# Patient Record
Sex: Male | Born: 1985 | Race: Black or African American | Hispanic: No | Marital: Single | Smoking: Light tobacco smoker
Health system: Southern US, Community
[De-identification: ages and names within clinical notes are randomized; demographics above are authoritative.]

## PROBLEM LIST (undated history)

## (undated) DIAGNOSIS — J309 Allergic rhinitis, unspecified: Secondary | ICD-10-CM

## (undated) DIAGNOSIS — E669 Obesity, unspecified: Secondary | ICD-10-CM

## (undated) DIAGNOSIS — N200 Calculus of kidney: Secondary | ICD-10-CM

## (undated) HISTORY — DX: Calculus of kidney: N20.0

## (undated) HISTORY — PX: LITHOTRIPSY: SUR834

## (undated) HISTORY — DX: Allergic rhinitis, unspecified: J30.9

## (undated) HISTORY — PX: WISDOM TOOTH EXTRACTION: SHX21

## (undated) HISTORY — DX: Obesity, unspecified: E66.9

---

## 2006-07-11 ENCOUNTER — Emergency Department (HOSPITAL_COMMUNITY): Admission: EM | Admit: 2006-07-11 | Discharge: 2006-07-11 | Payer: Self-pay | Admitting: Emergency Medicine

## 2007-04-21 ENCOUNTER — Emergency Department (HOSPITAL_COMMUNITY): Admission: EM | Admit: 2007-04-21 | Discharge: 2007-04-21 | Payer: Self-pay | Admitting: *Deleted

## 2008-03-28 ENCOUNTER — Emergency Department (HOSPITAL_COMMUNITY): Admission: EM | Admit: 2008-03-28 | Discharge: 2008-03-28 | Payer: Self-pay | Admitting: Emergency Medicine

## 2008-04-07 ENCOUNTER — Ambulatory Visit (HOSPITAL_COMMUNITY): Admission: RE | Admit: 2008-04-07 | Discharge: 2008-04-07 | Payer: Self-pay | Admitting: Urology

## 2008-06-05 ENCOUNTER — Emergency Department (HOSPITAL_COMMUNITY): Admission: EM | Admit: 2008-06-05 | Discharge: 2008-06-05 | Payer: Self-pay | Admitting: Emergency Medicine

## 2009-07-29 DIAGNOSIS — N200 Calculus of kidney: Secondary | ICD-10-CM

## 2009-07-29 HISTORY — DX: Calculus of kidney: N20.0

## 2009-08-23 ENCOUNTER — Emergency Department (HOSPITAL_COMMUNITY): Admission: EM | Admit: 2009-08-23 | Discharge: 2009-08-23 | Payer: Self-pay | Admitting: Emergency Medicine

## 2010-12-06 ENCOUNTER — Emergency Department (HOSPITAL_COMMUNITY)
Admission: EM | Admit: 2010-12-06 | Discharge: 2010-12-06 | Disposition: A | Discharge status: Home / Self Care | Payer: Self-pay | Attending: Emergency Medicine | Admitting: Emergency Medicine

## 2010-12-06 ENCOUNTER — Emergency Department (HOSPITAL_COMMUNITY): Payer: Self-pay

## 2010-12-06 DIAGNOSIS — R071 Chest pain on breathing: Secondary | ICD-10-CM | POA: Insufficient documentation

## 2010-12-09 ENCOUNTER — Emergency Department (HOSPITAL_COMMUNITY): Payer: Self-pay

## 2010-12-09 ENCOUNTER — Emergency Department (HOSPITAL_COMMUNITY)
Admission: EM | Admit: 2010-12-09 | Discharge: 2010-12-09 | Disposition: A | Discharge status: Home / Self Care | Payer: Self-pay | Attending: Emergency Medicine | Admitting: Emergency Medicine

## 2010-12-09 DIAGNOSIS — R0602 Shortness of breath: Secondary | ICD-10-CM | POA: Insufficient documentation

## 2010-12-09 DIAGNOSIS — R079 Chest pain, unspecified: Secondary | ICD-10-CM | POA: Insufficient documentation

## 2010-12-09 DIAGNOSIS — R071 Chest pain on breathing: Secondary | ICD-10-CM | POA: Insufficient documentation

## 2010-12-09 DIAGNOSIS — K7689 Other specified diseases of liver: Secondary | ICD-10-CM | POA: Insufficient documentation

## 2010-12-09 LAB — POCT I-STAT, CHEM 8
BUN: 12 mg/dL (ref 6–23)
Calcium, Ion: 1.15 mmol/L (ref 1.12–1.32)
Chloride: 102 mEq/L (ref 96–112)
Glucose, Bld: 91 mg/dL (ref 70–99)
HCT: 41 % (ref 39.0–52.0)
TCO2: 26 mmol/L (ref 0–100)

## 2010-12-09 LAB — POCT CARDIAC MARKERS: Troponin i, poc: <0.05 ng/mL (ref 0.00–0.09)

## 2010-12-09 MED ORDER — IOHEXOL 300 MG/ML  SOLN
100.0000 mL | Freq: Once | INTRAMUSCULAR | Status: AC | PRN
  Administered 2010-12-09: 100 mL via INTRAVENOUS

## 2011-04-30 LAB — URINALYSIS, ROUTINE W REFLEX MICROSCOPIC
Glucose, UA: NEGATIVE
Leukocytes, UA: NEGATIVE
Nitrite: NEGATIVE
Specific Gravity, Urine: 1.028
pH: 7

## 2011-04-30 LAB — URINE MICROSCOPIC-ADD ON

## 2011-06-18 ENCOUNTER — Emergency Department (HOSPITAL_COMMUNITY)
Admission: EM | Admit: 2011-06-18 | Discharge: 2011-06-18 | Disposition: A | Discharge status: Home / Self Care | Payer: Self-pay | Attending: Emergency Medicine | Admitting: Emergency Medicine

## 2011-06-18 DIAGNOSIS — R51 Headache: Secondary | ICD-10-CM | POA: Insufficient documentation

## 2011-06-18 DIAGNOSIS — J3489 Other specified disorders of nose and nasal sinuses: Secondary | ICD-10-CM | POA: Insufficient documentation

## 2011-06-18 DIAGNOSIS — J069 Acute upper respiratory infection, unspecified: Secondary | ICD-10-CM | POA: Insufficient documentation

## 2011-06-18 MED ORDER — DICLOFENAC SODIUM 75 MG PO TBEC: 75.0000 mg | DELAYED_RELEASE_TABLET | Freq: Two times a day (BID) | ORAL | Status: DC

## 2011-06-18 MED ORDER — LIDOCAINE VISCOUS 2 % MT SOLN: 20.0000 mL | OROMUCOSAL | Status: AC | PRN

## 2011-06-18 MED ORDER — PREDNISONE (PAK) 10 MG PO TABS: ORAL_TABLET | ORAL | Status: AC

## 2011-06-18 NOTE — ED Provider Notes (Signed)
History     CSN: [id number] Arrival date & time: 06/18/2011  6:46 PM   First MD Initiated Contact with Patient 06/18/11 2045   9:11 PM HPI  Patient is a 25 y.o. male presenting with URI. The history is provided by the patient.  URI The primary symptoms include headaches, sore throat and swollen glands. Primary symptoms do not include fever, ear pain, cough, wheezing, nausea, vomiting, arthralgias or rash. The current episode started today. The problem has been gradually worsening.  The sore throat is not accompanied by trouble swallowing.  The swelling is not associated with trouble swallowing.  Symptoms associated with the illness include facial pain, sinus pressure, congestion and rhinorrhea. The illness is not associated with chills. The following treatments were addressed: A decongestant was ineffective.    Past Medical History  Diagnosis Date  . Irregular heart beat     Past Surgical History  Procedure Date  . Lithotripsy     History reviewed. No pertinent family history.  History  Substance Use Topics  . Smoking status: Current Some Day Smoker  . Smokeless tobacco: Not on file  . Alcohol Use: Yes     occasionally      Review of Systems  Constitutional: Negative for fever and chills.  HENT: Positive for congestion, sore throat, rhinorrhea and sinus pressure. Negative for hearing loss, ear pain, mouth sores, trouble swallowing and dental problem.   Respiratory: Negative for cough, shortness of breath and wheezing.   Cardiovascular: Negative for chest pain.  Gastrointestinal: Negative for nausea and vomiting.  Musculoskeletal: Negative for arthralgias.  Skin: Negative for rash.  Neurological: Positive for headaches.    Allergies  Review of patient's allergies indicates no known allergies.  Home Medications   Current Outpatient Rx  Name Route Sig Dispense Refill  . NAPROXEN SODIUM 220 MG PO TABS Oral Take 220-440 mg by mouth as needed. Pain.       BP  132/78  Pulse 73  Temp(Src) 98 F (36.7 C) (Oral)  Resp 16  Ht 6\' 3"  (1.905 m)  Wt 220 lb (99.791 kg)  BMI 27.50 kg/m2  SpO2 100%  Physical Exam  Vitals reviewed. Constitutional: He is oriented to person, place, and time. He appears well-developed and well-nourished.  HENT:  Head: Normocephalic and atraumatic.  Right Ear: Tympanic membrane and ear canal normal.  Left Ear: External ear and ear canal normal.  Nose: Right sinus exhibits maxillary sinus tenderness. Right sinus exhibits no frontal sinus tenderness. Left sinus exhibits maxillary sinus tenderness. Left sinus exhibits no frontal sinus tenderness.  Mouth/Throat: Uvula is midline, oropharynx is clear and moist and mucous membranes are normal. No oropharyngeal exudate.       Transillumination negative for sinus infection.  Eyes: Conjunctivae are normal. Pupils are equal, round, and reactive to light.  Neck: Normal range of motion. Neck supple.  Cardiovascular: Normal rate, regular rhythm and normal heart sounds.   Pulmonary/Chest: Effort normal and breath sounds normal. He has no wheezes. He has no rales. He exhibits no tenderness.  Abdominal: Soft. Bowel sounds are normal.  Neurological: He is alert and oriented to person, place, and time.  Skin: Skin is warm and dry. No rash noted. No erythema. No pallor.  Psychiatric: He has a normal mood and affect. His behavior is normal.    ED Course  Procedures  This is requesting antibiotics even if strep test is normal. Had a discussion with patient and family regarding inappropriate antibiotic use. Patient is not happy with  this decision however advised him that I would not be prescribing antibiotics as strep test was negative. We'll give symptomatic treatment with viscous lidocaine, and NSAIDs. MDM          Thomasene Lot, PA 06/18/11 2118

## 2011-06-18 NOTE — ED Provider Notes (Signed)
Medical screening examination/treatment/procedure(s) were performed by non-physician practitioner and as supervising physician I was immediately available for consultation/collaboration.  Cristella Stiver K Srinivas Lippman-Rasch, MD 06/18/11 2246 

## 2011-11-12 ENCOUNTER — Encounter (HOSPITAL_COMMUNITY): Payer: Self-pay | Admitting: *Deleted

## 2011-11-12 ENCOUNTER — Emergency Department (HOSPITAL_COMMUNITY): Payer: Self-pay

## 2011-11-12 ENCOUNTER — Emergency Department (HOSPITAL_COMMUNITY)
Admission: EM | Admit: 2011-11-12 | Discharge: 2011-11-12 | Disposition: A | Discharge status: Home / Self Care | Payer: Self-pay | Attending: Emergency Medicine | Admitting: Emergency Medicine

## 2011-11-12 DIAGNOSIS — M25569 Pain in unspecified knee: Secondary | ICD-10-CM | POA: Insufficient documentation

## 2011-11-12 DIAGNOSIS — M25561 Pain in right knee: Secondary | ICD-10-CM

## 2011-11-12 DIAGNOSIS — M25469 Effusion, unspecified knee: Secondary | ICD-10-CM | POA: Insufficient documentation

## 2011-11-12 MED ORDER — OXYCODONE-ACETAMINOPHEN 5-325 MG PO TABS: 1.0000 | ORAL_TABLET | Freq: Four times a day (QID) | ORAL | Status: AC | PRN

## 2011-11-12 MED ORDER — IBUPROFEN 800 MG PO TABS
800.0000 mg | ORAL_TABLET | Freq: Once | ORAL | Status: AC
  Administered 2011-11-12: 800 mg via ORAL
  Filled 2011-11-12: qty 1

## 2011-11-12 MED ORDER — IBUPROFEN 600 MG PO TABS: 600.0000 mg | ORAL_TABLET | Freq: Four times a day (QID) | ORAL | Status: AC | PRN

## 2011-11-12 NOTE — ED Notes (Signed)
Patient transported to X-ray 

## 2011-11-12 NOTE — ED Provider Notes (Signed)
History     CSN: [id number]  Arrival date & time 11/12/11  1315   First MD Initiated Contact with Patient 11/12/11 1438      Chief Complaint  Patient presents with  . Knee Pain    (Consider location/radiation/quality/duration/timing/severity/associated sxs/prior treatment) The history is provided by the patient.  26 y/o M with c/c of right knee pain x 1 day. Playing basketball yesterday, slipped and sustained hyperextension injury. Pain constant, throbbing, worse with attempted knee movement and ambulation. Positive swelling. Negative associated numbness or weakness. No known relieving factors. No prior treatment.  Past Medical History  Diagnosis Date  . Irregular heart beat     Past Surgical History  Procedure Date  . Lithotripsy     No family history on file.  History  Substance Use Topics  . Smoking status: Former Games developer  . Smokeless tobacco: Not on file  . Alcohol Use: Yes     occasionally      Review of Systems  Constitutional: Negative for fever and chills.  Musculoskeletal:       See HPI, otherwise negative  Skin: Negative for color change and wound.  Neurological: Negative for weakness and numbness.    Allergies  Review of patient's allergies indicates no known allergies.  Home Medications  No current outpatient prescriptions on file.  BP 113/66  Pulse 95  Temp(Src) 98.4 F (36.9 C) (Oral)  Resp 18  Wt 230 lb 9.6 oz (104.599 kg)  SpO2 98%  Physical Exam  Nursing note and vitals reviewed. Constitutional: He is oriented to person, place, and time. He appears well-developed and well-nourished. No distress.  HENT:  Head: Normocephalic and atraumatic.  Right Ear: External ear normal.  Left Ear: External ear normal.  Eyes: Right eye exhibits no discharge. Left eye exhibits no discharge.  Neck: Neck supple.  Cardiovascular: Normal rate and regular rhythm.   Pulses:      Dorsalis pedis pulses are 2+ on the right side, and 2+ on the left side.    Pulmonary/Chest: Effort normal. No respiratory distress.  Musculoskeletal:       Right knee: He exhibits decreased range of motion and effusion. He exhibits no ecchymosis, no deformity, no erythema, normal alignment, no LCL laxity, normal patellar mobility and no MCL laxity. tenderness (popliteal fossa) found. Lateral joint line and LCL tenderness noted. No medial joint line, no MCL and no patellar tendon tenderness noted.       Isolated hip flex/ext, knee flex/ext, foot plantar/dorsi flex 5/5 and symmetric bilaterally with small movements as pt does not tolerate full ROM in the right knee. MSK exam otherwise without tenderness or edema.  Neurological: He is alert and oriented to person, place, and time.       Sensation intact to light touch and symmetric in BLE distally. Limping/antalgic gait without ataxia.  Skin: Skin is warm and dry. No rash noted. No erythema.  Psychiatric: He has a normal mood and affect.    ED Course  Procedures (including critical care time)  Labs Reviewed - No data to display Dg Knee Complete 4 Views Right  11/12/2011  *RADIOLOGY REPORT*  Clinical Data: Fall, lateral knee pain  RIGHT KNEE - COMPLETE 4+ VIEW  Comparison: None.  Findings: Trace suprapatellar fluid is noted.  No fracture or dislocation.  No radiopaque foreign body.  IMPRESSION: No acute bony abnormality.  Trace suprapatellar effusion.  Original Report Authenticated By: Harrel Lemon, M.D.     Dx 1: Right knee pain/injury  MDM  Knee injury. I personally viewed the x-ray images. No bony finding, only trace effusion. Exam with sig tenderness, small effusion, limited ROM. Distal NVI. Will place in knee immobilizer and advised ortho f/u. Pt voices understanding of plan.         Shaaron Adler, PA-C 11/12/11 1525

## 2011-11-12 NOTE — ED Provider Notes (Signed)
Medical screening examination/treatment/procedure(s) were performed by non-physician practitioner and as supervising physician I was immediately available for consultation/collaboration.  Hanzel Pizzo, MD 11/12/11 1536 

## 2011-11-12 NOTE — ED Notes (Signed)
Pt states "playing basketball last nigh, floor was slippery & my knee didn't stop in locking position, thought it would feel better"

## 2011-11-12 NOTE — Discharge Instructions (Signed)
As we discussed, your x-ray showed no broken bones. However, this does not mean there is no damage to your cartilage or knee ligaments. You should follow-up with the orthopedic doctor if your symptoms have not started to improve in the next several days, or as needed.     Knee Pain The knee is the complex joint between your thigh and your lower leg. It is made up of bones, tendons, ligaments, and cartilage. The bones that make up the knee are:  The femur in the thigh.   The tibia and fibula in the lower leg.   The patella or kneecap riding in the groove on the lower femur.  CAUSES  Knee pain is a common complaint with many causes. A few of these causes are:  Injury, such as:   A ruptured ligament or tendon injury.   Torn cartilage.   Medical conditions, such as:   Gout   Arthritis   Infections   Overuse, over training or overdoing a physical activity.  Knee pain can be minor or severe. Knee pain can accompany debilitating injury. Minor knee problems often respond well to self-care measures or get well on their own. More serious injuries may need medical intervention or even surgery. SYMPTOMS The knee is complex. Symptoms of knee problems can vary widely. Some of the problems are:  Pain with movement and weight bearing.   Swelling and tenderness.   Buckling of the knee.   Inability to straighten or extend your knee.   Your knee locks and you cannot straighten it.   Warmth and redness with pain and fever.   Deformity or dislocation of the kneecap.  DIAGNOSIS  Determining what is wrong may be very straight forward such as when there is an injury. It can also be challenging because of the complexity of the knee. Tests to make a diagnosis may include:  Your caregiver taking a history and doing a physical exam.   Routine X-rays can be used to rule out other problems. X-rays will not reveal a cartilage tear. Some injuries of the knee can be diagnosed by:   Arthroscopy  a surgical technique by which a small video camera is inserted through tiny incisions on the sides of the knee. This procedure is used to examine and repair internal knee joint problems. Tiny instruments can be used during arthroscopy to repair the torn knee cartilage (meniscus).   Arthrography is a radiology technique. A contrast liquid is directly injected into the knee joint. Internal structures of the knee joint then become visible on X-ray film.   An MRI scan is a non x-ray radiology procedure in which magnetic fields and a computer produce two- or three-dimensional images of the inside of the knee. Cartilage tears are often visible using an MRI scanner. MRI scans have largely replaced arthrography in diagnosing cartilage tears of the knee.   Blood work.   Examination of the fluid that helps to lubricate the knee joint (synovial fluid). This is done by taking a sample out using a needle and a syringe.  TREATMENT The treatment of knee problems depends on the cause. Some of these treatments are:  Depending on the injury, proper casting, splinting, surgery or physical therapy care will be needed.   Give yourself adequate recovery time. Do not overuse your joints. If you begin to get sore during workout routines, back off. Slow down or do fewer repetitions.   For repetitive activities such as cycling or running, maintain your strength and nutrition.  Alternate muscle groups. For example if you are a weight lifter, work the upper body on one day and the lower body the next.   Either tight or weak muscles do not give the proper support for your knee. Tight or weak muscles do not absorb the stress placed on the knee joint. Keep the muscles surrounding the knee strong.   Take care of mechanical problems.   If you have flat feet, orthotics or special shoes may help. See your caregiver if you need help.   Arch supports, sometimes with wedges on the inner or outer aspect of the heel, can help.  These can shift pressure away from the side of the knee most bothered by osteoarthritis.   A brace called an "unloader" brace also may be used to help ease the pressure on the most arthritic side of the knee.   If your caregiver has prescribed crutches, braces, wraps or ice, use as directed. The acronym for this is PRICE. This means protection, rest, ice, compression and elevation.   Nonsteroidal anti-inflammatory drugs (NSAID's), can help relieve pain. But if taken immediately after an injury, they may actually increase swelling. Take NSAID's with food in your stomach. Stop them if you develop stomach problems. Do not take these if you have a history of ulcers, stomach pain or bleeding from the bowel. Do not take without your caregiver's approval if you have problems with fluid retention, heart failure, or kidney problems.   For ongoing knee problems, physical therapy may be helpful.   Glucosamine and chondroitin are over-the-counter dietary supplements. Both may help relieve the pain of osteoarthritis in the knee. These medicines are different from the usual anti-inflammatory drugs. Glucosamine may decrease the rate of cartilage destruction.   Injections of a corticosteroid drug into your knee joint may help reduce the symptoms of an arthritis flare-up. They may provide pain relief that lasts a few months. You may have to wait a few months between injections. The injections do have a small increased risk of infection, water retention and elevated blood sugar levels.   Hyaluronic acid injected into damaged joints may ease pain and provide lubrication. These injections may work by reducing inflammation. A series of shots may give relief for as long as 6 months.   Topical painkillers. Applying certain ointments to your skin may help relieve the pain and stiffness of osteoarthritis. Ask your pharmacist for suggestions. Many over the-counter products are approved for temporary relief of arthritis pain.     In some countries, doctors often prescribe topical NSAID's for relief of chronic conditions such as arthritis and tendinitis. A review of treatment with NSAID creams found that they worked as well as oral medications but without the serious side effects.  PREVENTION  Maintain a healthy weight. Extra pounds put more strain on your joints.   Get strong, stay limber. Weak muscles are a common cause of knee injuries. Stretching is important. Include flexibility exercises in your workouts.   Be smart about exercise. If you have osteoarthritis, chronic knee pain or recurring injuries, you may need to change the way you exercise. This does not mean you have to stop being active. If your knees ache after jogging or playing basketball, consider switching to swimming, water aerobics or other low-impact activities, at least for a few days a week. Sometimes limiting high-impact activities will provide relief.   Make sure your shoes fit well. Choose footwear that is right for your sport.   Protect your knees. Use the proper  gear for knee-sensitive activities. Use kneepads when playing volleyball or laying carpet. Buckle your seat belt every time you drive. Most shattered kneecaps occur in car accidents.   Rest when you are tired.  SEEK MEDICAL CARE IF:  You have knee pain that is continual and does not seem to be getting better.  SEEK IMMEDIATE MEDICAL CARE IF:  Your knee joint feels hot to the touch and you have a high fever. MAKE SURE YOU:   Understand these instructions.   Will watch your condition.   Will get help right away if you are not doing well or get worse.  Document Released: 05/12/2007 Document Revised: 07/04/2011 Document Reviewed: 05/12/2007 [facility] Patient Information 2012 [place].

## 2013-01-31 ENCOUNTER — Encounter (HOSPITAL_COMMUNITY): Payer: Self-pay | Admitting: Emergency Medicine

## 2013-01-31 ENCOUNTER — Emergency Department (HOSPITAL_COMMUNITY): Payer: Self-pay

## 2013-01-31 ENCOUNTER — Emergency Department (HOSPITAL_COMMUNITY)
Admission: EM | Admit: 2013-01-31 | Discharge: 2013-01-31 | Disposition: A | Discharge status: Home / Self Care | Payer: Self-pay | Attending: Emergency Medicine | Admitting: Emergency Medicine

## 2013-01-31 DIAGNOSIS — Z87448 Personal history of other diseases of urinary system: Secondary | ICD-10-CM | POA: Insufficient documentation

## 2013-01-31 DIAGNOSIS — M542 Cervicalgia: Secondary | ICD-10-CM | POA: Insufficient documentation

## 2013-01-31 DIAGNOSIS — J069 Acute upper respiratory infection, unspecified: Secondary | ICD-10-CM | POA: Insufficient documentation

## 2013-01-31 DIAGNOSIS — Z8679 Personal history of other diseases of the circulatory system: Secondary | ICD-10-CM | POA: Insufficient documentation

## 2013-01-31 DIAGNOSIS — J029 Acute pharyngitis, unspecified: Secondary | ICD-10-CM | POA: Insufficient documentation

## 2013-01-31 DIAGNOSIS — R509 Fever, unspecified: Secondary | ICD-10-CM | POA: Insufficient documentation

## 2013-01-31 DIAGNOSIS — R51 Headache: Secondary | ICD-10-CM | POA: Insufficient documentation

## 2013-01-31 DIAGNOSIS — Z87891 Personal history of nicotine dependence: Secondary | ICD-10-CM | POA: Insufficient documentation

## 2013-01-31 DIAGNOSIS — J3489 Other specified disorders of nose and nasal sinuses: Secondary | ICD-10-CM | POA: Insufficient documentation

## 2013-01-31 MED ORDER — HYDROCOD POLST-CHLORPHEN POLST 10-8 MG/5ML PO LQCR: 5.0000 mL | Freq: Two times a day (BID) | ORAL | Status: DC

## 2013-01-31 MED ORDER — AZITHROMYCIN 250 MG PO TABS: 250.0000 mg | ORAL_TABLET | Freq: Every day | ORAL | Status: DC

## 2013-01-31 NOTE — ED Provider Notes (Signed)
Medical screening examination/treatment/procedure(s) were performed by non-physician practitioner and as supervising physician I was immediately available for consultation/collaboration.  Ethelda Chick, MD 01/31/13 [phone]

## 2013-01-31 NOTE — ED Provider Notes (Signed)
History  This chart was scribed for [facility] Sharisa Toves - PA by Manuela Schwartz, ED scribe. This patient was seen in room WTR9/WTR9 and the patient's care was started at 2153.  CSN: [id number] Arrival date & time 01/31/13  2057  First MD Initiated Contact with Patient 01/31/13 2153     Chief Complaint  Patient presents with  . Cough   Patient is a 27 y.o. male presenting with cough. The history is provided by the patient. No language interpreter was used.  Cough Cough characteristics:  Harsh Severity:  Moderate Onset quality:  Gradual Duration:  1 week Timing:  Constant Progression:  Worsening Chronicity:  New Context: not sick contacts   Relieved by:  Nothing Worsened by:  Nothing tried Ineffective treatments:  Decongestant Associated symptoms: chills, fever, headaches and sore throat   Associated symptoms: no chest pain, no eye discharge, no rash and no shortness of breath    HPI Comments: Paul Gamble is a 27 y.o. male who presents to the Emergency Department complaining of constant, gradually worsening cough/congestion, onset 8 days ago. He states this cough/congestion is worse than he has had in the past and his coughing makes him have a HA and sob. He also states associated sore throat, fevers/chills. He states has tried OTC medicines without any improvement in his cough/congestion.    Past Medical History  Diagnosis Date  . Irregular heart beat   . Renal disorder    Past Surgical History  Procedure Laterality Date  . Lithotripsy     History reviewed. No pertinent family history. History  Substance Use Topics  . Smoking status: Former Games developer  . Smokeless tobacco: Not on file  . Alcohol Use: Yes     Comment: occasionally    Review of Systems  Constitutional: Positive for fever and chills.  HENT: Positive for congestion, sore throat, neck pain and sinus pressure. Negative for neck stiffness and ear discharge.   Eyes: Negative for discharge.  Respiratory: Positive  for cough. Negative for shortness of breath.   Cardiovascular: Negative for chest pain.  Gastrointestinal: Negative for nausea, vomiting, abdominal pain and diarrhea.  Musculoskeletal: Negative for back pain.  Skin: Negative for color change, pallor and rash.  Neurological: Positive for headaches. Negative for syncope and weakness.  All other systems reviewed and are negative.   A complete 10 system review of systems was obtained and all systems are negative except as noted in the HPI and PMH.   Allergies  Review of patient's allergies indicates no known allergies.  Home Medications   Current Outpatient Rx  Name  Route  Sig  Dispense  Refill  . azithromycin (ZITHROMAX) 250 MG tablet   Oral   Take 1 tablet (250 mg total) by mouth daily. Take first 2 tablets together, then 1 every day until finished.   6 tablet   0   . chlorpheniramine-HYDROcodone (TUSSIONEX PENNKINETIC ER) 10-8 MG/5ML LQCR   Oral   Take 5 mLs by mouth every 12 (twelve) hours.   140 mL   0   . ibuprofen (ADVIL,MOTRIN) 400 MG tablet   Oral   Take 400 mg by mouth every 6 (six) hours as needed. For pain relief          Triage Vitals: BP 126/82  Pulse 95  Temp(Src) 100.1 F (37.8 C) (Oral)  Resp 24  Ht 6\' 3"  (1.905 m)  Wt 241 lb 3 oz (109.402 kg)  BMI 30.15 kg/m2  SpO2 100% Physical Exam  Nursing note  and vitals reviewed. Constitutional: He is oriented to person, place, and time. He appears well-developed and well-nourished. No distress.  HENT:  Head: Normocephalic and atraumatic.  Nose: Right sinus exhibits maxillary sinus tenderness and frontal sinus tenderness. Left sinus exhibits maxillary sinus tenderness and frontal sinus tenderness.  Eyes: EOM are normal.  Neck: Neck supple. No tracheal deviation present.  Cardiovascular: Normal rate.   Pulmonary/Chest: Effort normal. No respiratory distress. He has no wheezes. He has no rhonchi.  Coughing during exam  Musculoskeletal: Normal range of motion.   Neurological: He is alert and oriented to person, place, and time.  Skin: Skin is warm and dry.  Psychiatric: He has a normal mood and affect. His behavior is normal.    ED Course  Procedures (including critical care time) DIAGNOSTIC STUDIES: Oxygen Saturation is 100% on room air, normal by my interpretation.    COORDINATION OF CARE: At 1025 PM Discussed treatment plan with patient which includes CXR. Patient agrees.   Labs Reviewed - No data to display Dg Chest 2 View  01/31/2013   *RADIOLOGY REPORT*  Clinical Data: Cough.  Low grade fever.  CHEST - 2 VIEW  Comparison: 12/06/2010  Findings: Heart and mediastinal contours are within normal limits. No focal opacities or effusions.  No acute bony abnormality.  IMPRESSION: No active cardiopulmonary disease.   Original Report Authenticated By: Charlett Nose, M.D.   1. URI (upper respiratory infection)     MDM  Azithromycin and Tussionex Rx. To follow-up with PCP.  26 y.o.Paul Nestle Brucker's evaluation in the Emergency Department is complete. It has been determined that no acute conditions requiring further emergency intervention are present at this time. The patient/guardian have been advised of the diagnosis and plan. We have discussed signs and symptoms that warrant return to the ED, such as changes or worsening in symptoms.  Vital signs are stable at discharge. Filed Vitals:   01/31/13 2138  BP: 126/82  Pulse: 95  Temp: 100.1 F (37.8 C)  Resp: 24    Patient/guardian has voiced understanding and agreed to follow-up with the PCP or specialist.  I personally performed the services described in this documentation, which was scribed in my presence. The recorded information has been reviewed and is accurate.    Dorthula Matas, PA-C 01/31/13 2235

## 2013-12-22 ENCOUNTER — Ambulatory Visit (INDEPENDENT_AMBULATORY_CARE_PROVIDER_SITE_OTHER): Payer: PRIVATE HEALTH INSURANCE | Admitting: Medical

## 2013-12-22 ENCOUNTER — Encounter: Payer: Self-pay | Admitting: Medical

## 2013-12-22 VITALS — BP 110/80 | HR 72 | Temp 98.1°F | Resp 14 | Ht 74.4 in | Wt 258.0 lb

## 2013-12-22 DIAGNOSIS — Z113 Encounter for screening for infections with a predominantly sexual mode of transmission: Secondary | ICD-10-CM

## 2013-12-22 DIAGNOSIS — Z0000 Encounter for general adult medical examination without abnormal findings: Secondary | ICD-10-CM

## 2013-12-22 DIAGNOSIS — G4710 Hypersomnia, unspecified: Secondary | ICD-10-CM

## 2013-12-22 DIAGNOSIS — Z23 Encounter for immunization: Secondary | ICD-10-CM

## 2013-12-22 DIAGNOSIS — R0683 Snoring: Secondary | ICD-10-CM

## 2013-12-22 DIAGNOSIS — J309 Allergic rhinitis, unspecified: Secondary | ICD-10-CM

## 2013-12-22 DIAGNOSIS — R0989 Other specified symptoms and signs involving the circulatory and respiratory systems: Secondary | ICD-10-CM

## 2013-12-22 DIAGNOSIS — R400 Somnolence: Secondary | ICD-10-CM

## 2013-12-22 DIAGNOSIS — E669 Obesity, unspecified: Secondary | ICD-10-CM

## 2013-12-22 DIAGNOSIS — G478 Other sleep disorders: Secondary | ICD-10-CM

## 2013-12-22 DIAGNOSIS — R0609 Other forms of dyspnea: Secondary | ICD-10-CM

## 2013-12-22 LAB — COMPREHENSIVE METABOLIC PANEL
ALBUMIN: 4.5 g/dL (ref 3.5–5.2)
ALK PHOS: 61 U/L (ref 39–117)
ALT: 55 U/L — ABNORMAL HIGH (ref 0–53)
AST: 32 U/L (ref 0–37)
BUN: 11 mg/dL (ref 6–23)
CO2: 29 meq/L (ref 19–32)
Calcium: 9.6 mg/dL (ref 8.4–10.5)
Chloride: 100 mEq/L (ref 96–112)
Creat: 1.01 mg/dL (ref 0.50–1.35)
GLUCOSE: 97 mg/dL (ref 70–99)
POTASSIUM: 4.6 meq/L (ref 3.5–5.3)
SODIUM: 138 meq/L (ref 135–145)
TOTAL PROTEIN: 7.9 g/dL (ref 6.0–8.3)
Total Bilirubin: 0.8 mg/dL (ref 0.2–1.2)

## 2013-12-22 LAB — CBC WITH DIFFERENTIAL/PLATELET
BASOS ABS: 0.1 10*3/uL (ref 0.0–0.1)
BASOS PCT: 1 % (ref 0–1)
EOS ABS: 0.2 10*3/uL (ref 0.0–0.7)
Eosinophils Relative: 2 % (ref 0–5)
HCT: 41.5 % (ref 39.0–52.0)
Hemoglobin: 14 g/dL (ref 13.0–17.0)
LYMPHS ABS: 2.5 10*3/uL (ref 0.7–4.0)
Lymphocytes Relative: 30 % (ref 12–46)
MCH: 29.4 pg (ref 26.0–34.0)
MCHC: 33.7 g/dL (ref 30.0–36.0)
MCV: 87 fL (ref 78.0–100.0)
Monocytes Absolute: 0.7 10*3/uL (ref 0.1–1.0)
Monocytes Relative: 9 % (ref 3–12)
NEUTROS PCT: 58 % (ref 43–77)
Neutro Abs: 4.8 10*3/uL (ref 1.7–7.7)
PLATELETS: 321 10*3/uL (ref 150–400)
RBC: 4.77 MIL/uL (ref 4.22–5.81)
RDW: 13.7 % (ref 11.5–15.5)
WBC: 8.3 10*3/uL (ref 4.0–10.5)

## 2013-12-22 LAB — POCT URINALYSIS DIPSTICK
Bilirubin, UA: NEGATIVE
Blood, UA: NEGATIVE
Glucose, UA: NEGATIVE
Ketones, UA: NEGATIVE
LEUKOCYTES UA: NEGATIVE
NITRITE UA: NEGATIVE
PH UA: 5
PROTEIN UA: NEGATIVE
Spec Grav, UA: 1.015
Urobilinogen, UA: NEGATIVE

## 2013-12-22 LAB — LIPID PANEL
CHOL/HDL RATIO: 4.7 ratio
Cholesterol: 211 mg/dL — ABNORMAL HIGH (ref 0–200)
HDL: 45 mg/dL (ref >39)
LDL Cholesterol: 148 mg/dL — ABNORMAL HIGH (ref 0–99)
Triglycerides: 92 mg/dL (ref <150)
VLDL: 18 mg/dL (ref 0–40)

## 2013-12-22 LAB — TSH: TSH: 1.587 u[IU]/mL (ref 0.350–4.500)

## 2013-12-22 LAB — T4, FREE: Free T4: 1.14 ng/dL (ref 0.80–1.80)

## 2013-12-22 MED ORDER — IPRATROPIUM BROMIDE 0.03 % NA SOLN: 2.0000 | Freq: Two times a day (BID) | NASAL | Status: DC

## 2013-12-22 NOTE — Patient Instructions (Signed)
Thank you for giving me the opportunity to serve you today.    Your diagnosis today includes: Encounter Diagnoses  Name Primary?  . Routine general medical examination at a health care facility Yes  . Screen for STD (sexually transmitted disease)   . Need for Tdap vaccination   . Non-restorative sleep   . Daytime somnolence   . Snoring   . Obesity, unspecified   . Allergic rhinitis      Specific recommendations today include:  I recommend you see a dentist yearly for routine care and cleaning  I recommend you continue efforts at exercise and healthy diet to lose weight  depending on your lab results we may be able to consider prescription weight loss medication  We updated your Tdap vaccination today, tetanus diphtheria pertussis  We will call back with routine lab results  Return pending labs.    I have included other useful information below for your review.  Preventative Care for Adults, Male       REGULAR HEALTH EXAMS:  A routine yearly physical is a good way to check in with your primary care provider about your health and preventive screening. It is also an opportunity to share updates about your health and any concerns you have, and receive a thorough all-over exam.   Most health insurance companies pay for at least some preventative services.  Check with your health plan for specific coverages.  WHAT PREVENTATIVE SERVICES DO MEN NEED?  Adult men should have their weight and blood pressure checked regularly.   Men age 7 and older should have their cholesterol levels checked regularly.  Beginning at age 80 and continuing to age 69, men should be screened for colorectal cancer.  Certain people should may need continued testing until age 6.  Other cancer screening may include exams for testicular and prostate cancer.  Updating vaccinations is part of preventative care.  Vaccinations help protect against diseases such as the flu.  Lab tests are generally  done as part of preventative care to screen for anemia and blood disorders, to screen for problems with the kidneys and liver, to screen for bladder problems, to check blood sugar, and to check your cholesterol level.  Preventative services generally include counseling about diet, exercise, avoiding tobacco, drugs, excessive alcohol consumption, and sexually transmitted infections.    GENERAL RECOMMENDATIONS FOR GOOD HEALTH:  Healthy diet:  Eat a variety of foods, including fruit, vegetables, animal or vegetable protein, such as meat, fish, chicken, and eggs, or beans, lentils, tofu, and grains, such as rice.  Drink plenty of water daily.  Decrease saturated fat in the diet, avoid lots of red meat, processed foods, sweets, fast foods, and fried foods.  Exercise:  Aerobic exercise helps maintain good heart health. At least 30-40 minutes of moderate-intensity exercise is recommended. For example, a brisk walk that increases your heart rate and breathing. This should be done on most days of the week.   Find a type of exercise or a variety of exercises that you enjoy so that it becomes a part of your daily life.  Examples are running, walking, swimming, water aerobics, and biking.  For motivation and support, explore group exercise such as aerobic class, spin class, Zumba, Yoga,or  martial arts, etc.    Set exercise goals for yourself, such as a certain weight goal, walk or run in a race such as a 5k walk/run.  Speak to your primary care provider about exercise goals.  Disease prevention:  If you  smoke or chew tobacco, find out from your caregiver how to quit. It can literally save your life, no matter how long you have been a tobacco user. If you do not use tobacco, never begin.   Maintain a healthy diet and normal weight. Increased weight leads to problems with blood pressure and diabetes.   The Body Mass Index or BMI is a way of measuring how much of your body is fat. Having a BMI above 27  increases the risk of heart disease, diabetes, hypertension, stroke and other problems related to obesity. Your caregiver can help determine your BMI and based on it develop an exercise and dietary program to help you achieve or maintain this important measurement at a healthful level.  High blood pressure causes heart and blood vessel problems.  Persistent high blood pressure should be treated with medicine if weight loss and exercise do not work.   Fat and cholesterol leaves deposits in your arteries that can block them. This causes heart disease and vessel disease elsewhere in your body.  If your cholesterol is found to be high, or if you have heart disease or certain other medical conditions, then you may need to have your cholesterol monitored frequently and be treated with medication.   Ask if you should have a stress test if your history suggests this. A stress test is a test done on a treadmill that looks for heart disease. This test can find disease prior to there being a problem.  Avoid drinking alcohol in excess (more than two drinks per day).  Avoid use of street drugs. Do not share needles with anyone. Ask for professional help if you need assistance or instructions on stopping the use of alcohol, cigarettes, and/or drugs.  Brush your teeth twice a day with fluoride toothpaste, and floss once a day. Good oral hygiene prevents tooth decay and gum disease. The problems can be painful, unattractive, and can cause other health problems. Visit your dentist for a routine oral and dental check up and preventive care every 6-12 months.   Look at your skin regularly.  Use a mirror to look at your back. Notify your caregivers of changes in moles, especially if there are changes in shapes, colors, a size larger than a pencil eraser, an irregular border, or development of new moles.  Safety:  Use seatbelts 100% of the time, whether driving or as a passenger.  Use safety devices such as hearing  protection if you work in environments with loud noise or significant background noise.  Use safety glasses when doing any work that could send debris in to the eyes.  Use a helmet if you ride a bike or motorcycle.  Use appropriate safety gear for contact sports.  Talk to your caregiver about gun safety.  Use sunscreen with a SPF (or skin protection factor) of 15 or greater.  Lighter skinned people are at a greater risk of skin cancer. Don't forget to also wear sunglasses in order to protect your eyes from too much damaging sunlight. Damaging sunlight can accelerate cataract formation.   Practice safe sex. Use condoms. Condoms are used for birth control and to help reduce the spread of sexually transmitted infections (or STIs).  Some of the STIs are gonorrhea (the clap), chlamydia, syphilis, trichomonas, herpes, HPV (human papilloma virus) and HIV (human immunodeficiency virus) which causes AIDS. The herpes, HIV and HPV are viral illnesses that have no cure. These can result in disability, cancer and death.   Keep carbon monoxide  and smoke detectors in your home functioning at all times. Change the batteries every 6 months or use a model that plugs into the wall.   Vaccinations:  Stay up to date with your tetanus shots and other required immunizations. You should have a booster for tetanus every 10 years. Be sure to get your flu shot every year, since 5%-20% of the [place] population comes down with the flu. The flu vaccine changes each year, so being vaccinated once is not enough. Get your shot in the fall, before the flu season peaks.   Other vaccines to consider:  Pneumococcal vaccine to protect against certain types of pneumonia.  This is normally recommended for adults age 27 or older.  However, adults younger than 27 years old with certain underlying conditions such as diabetes, heart or lung disease should also receive the vaccine.  Shingles vaccine to protect against Varicella Zoster if you are  older than age 27, or younger than 27 years old with certain underlying illness.  Hepatitis A vaccine to protect against a form of infection of the liver by a virus acquired from food.  Hepatitis B vaccine to protect against a form of infection of the liver by a virus acquired from blood or body fluids, particularly if you work in health care.  If you plan to travel internationally, check with your local health department for specific vaccination recommendations.  Cancer Screening:  Most routine colon cancer screening begins at the age of 27. On a yearly basis, doctors may provide special easy to use take-home tests to check for hidden blood in the stool. Sigmoidoscopy or colonoscopy can detect the earliest forms of colon cancer and is life saving. These tests use a small camera at the end of a tube to directly examine the colon. Speak to your caregiver about this at age 27, when routine screening begins (and is repeated every 5 years unless early forms of pre-cancerous polyps or small growths are found).   At the age of 27 men usually start screening for prostate cancer every year. Screening may begin at a younger age for those with higher risk. Those at higher risk include African-Americans or having a family history of prostate cancer. There are two types of tests for prostate cancer:   Prostate-specific antigen (PSA) testing. Recent studies raise questions about prostate cancer using PSA and you should discuss this with your caregiver.   Digital rectal exam (in which your doctor's lubricated and gloved finger feels for enlargement of the prostate through the anus).   Screening for testicular cancer.  Do a monthly exam of your testicles. Gently roll each testicle between your thumb and fingers, feeling for any abnormal lumps. The best time to do this is after a hot shower or bath when the tissues are looser. Notify your caregivers of any lumps, tenderness or changes in size or shape immediately.
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
## 2013-12-23 ENCOUNTER — Other Ambulatory Visit: Payer: Self-pay | Admitting: Medical

## 2013-12-23 LAB — RPR

## 2013-12-23 LAB — HIV ANTIBODY (ROUTINE TESTING W REFLEX): HIV 1&2 Ab, 4th Generation: NONREACTIVE

## 2013-12-23 LAB — GC/CHLAMYDIA PROBE AMP
CT Probe RNA: NEGATIVE
GC PROBE AMP APTIMA: NEGATIVE

## 2013-12-23 MED ORDER — PHENTERMINE-TOPIRAMATE ER 7.5-46 MG PO CP24: 1.0000 | ORAL_CAPSULE | ORAL | Status: DC

## 2013-12-23 MED ORDER — PHENTERMINE-TOPIRAMATE ER 3.75-23 MG PO CP24: 1.0000 | ORAL_CAPSULE | ORAL | Status: DC

## 2014-04-01 ENCOUNTER — Ambulatory Visit (INDEPENDENT_AMBULATORY_CARE_PROVIDER_SITE_OTHER): Payer: PRIVATE HEALTH INSURANCE | Admitting: Medical

## 2014-04-01 ENCOUNTER — Encounter: Payer: Self-pay | Admitting: Medical

## 2014-04-01 VITALS — BP 122/76 | HR 68 | Temp 98.0°F | Resp 16 | Wt 264.0 lb

## 2014-04-01 DIAGNOSIS — G479 Sleep disorder, unspecified: Secondary | ICD-10-CM

## 2014-04-01 DIAGNOSIS — J3489 Other specified disorders of nose and nasal sinuses: Secondary | ICD-10-CM

## 2014-04-01 DIAGNOSIS — G478 Other sleep disorders: Secondary | ICD-10-CM

## 2014-04-01 DIAGNOSIS — G4710 Hypersomnia, unspecified: Secondary | ICD-10-CM

## 2014-04-01 DIAGNOSIS — R400 Somnolence: Secondary | ICD-10-CM

## 2014-04-01 DIAGNOSIS — E669 Obesity, unspecified: Secondary | ICD-10-CM

## 2014-04-01 DIAGNOSIS — R0981 Nasal congestion: Secondary | ICD-10-CM

## 2014-04-01 MED ORDER — TOPIRAMATE 25 MG PO TABS: 25.0000 mg | ORAL_TABLET | Freq: Two times a day (BID) | ORAL | Status: DC

## 2014-04-01 MED ORDER — PHENTERMINE HCL 37.5 MG PO TABS: 37.5000 mg | ORAL_TABLET | Freq: Every day | ORAL | Status: DC

## 2014-04-01 MED ORDER — MOMETASONE FUROATE 50 MCG/ACT NA SUSP: 2.0000 | Freq: Every day | NASAL | Status: DC

## 2014-04-01 MED ORDER — BECLOMETHASONE DIPROPIONATE 80 MCG/ACT NA AERS: 1.0000 | INHALATION_SPRAY | Freq: Two times a day (BID) | NASAL | Status: DC
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
## 2014-04-01 NOTE — Patient Instructions (Signed)
Thank you for giving me the opportunity to serve you today.    Your diagnosis today includes: Encounter Diagnoses  Name Primary?  . Nasal sinus congestion Yes  . Non-restorative sleep   . Daytime somnolence   . Sleep disturbance   . Obesity, unspecified      Specific recommendations today include:  For nasal congestion, begin sample of Qnasal nasal spray, 1 spray per nostril twice daily "sniff sniff"  After you run out of this, you can try Nasonex nasal spray the same way, 1-2 sprays per nostril twice daily  Let me know which works better  One other potential step is to take Zyrtec OTC at bedtime daily  For weight loss  Begin Phentermine appetite suppressant once daily at the start of your day  After 1 week of taking Phentermine, if no side effects, then begin Topamax  daily at bedtime for 2 weeks, then go to twice daily  We will set you up for sleep study to evaluate for sleep apnea  Return 4-6 wk.    I have included other useful information below for your review.  Phentermine tablets or capsules What is this medicine? PHENTERMINE (FEN ter meen) decreases your appetite. It is used with a reduced calorie diet and exercise to help you lose weight. This medicine may be used for other purposes; ask your health care provider or pharmacist if you have questions. COMMON BRAND NAME(S): Adipex-P, Atti-Plex P, Atti-Plex P Spansule, Fastin, Pro-Fast, Tara-8 What should I tell my health care provider before I take this medicine? They need to know if you have any of these conditions: -agitation -glaucoma -heart disease -high blood pressure -history of substance abuse -lung disease called Primary Pulmonary Hypertension (PPH) -taken an MAOI like Carbex, Eldepryl, Marplan, Nardil, or Parnate in last 14 days -thyroid disease -an unusual or allergic reaction to phentermine, other medicines, foods, dyes, or preservatives -pregnant or trying to get  pregnant -breast-feeding How should I use this medicine? Take this medicine by mouth with a glass of water. Follow the directions on the prescription label. This medicine is usually taken 30 minutes before or 1 to 2 hours after breakfast. Avoid taking this medicine in the evening. It may interfere with sleep. Take your doses at regular intervals. Do not take your medicine more often than directed. Talk to your pediatrician regarding the use of this medicine in children. Special care may be needed. Overdosage: If you think you have taken too much of this medicine contact a poison control center or emergency room at once. NOTE: This medicine is only for you. Do not share this medicine with others. What if I miss a dose? If you miss a dose, take it as soon as you can. If it is almost time for your next dose, take only that dose. Do not take double or extra doses. What may interact with this medicine? Do not take this medicine with any of the following medications: -duloxetine -MAOIs like Carbex, Eldepryl, Marplan, Nardil, and Parnate -medicines for colds or breathing difficulties like pseudoephedrine or phenylephrine -procarbazine -sibutramine -SSRIs like citalopram, escitalopram, fluoxetine, fluvoxamine, paroxetine, and sertraline -stimulants like dexmethylphenidate, methylphenidate or modafinil -venlafaxine This medicine may also interact with the following medications: -medicines for diabetes This list may not describe all possible interactions. Give your health care provider a list of all the medicines, herbs, non-prescription drugs, or dietary supplements you use. Also tell them if you smoke, drink alcohol, or use illegal drugs. Some items may interact with your medicine.  What should I watch for while using this medicine? Notify your physician immediately if you become short of breath while doing your normal activities. Do not take this medicine within 6 hours of bedtime. It can keep you  from getting to sleep. Avoid drinks that contain caffeine and try to stick to a regular bedtime every night. This medicine was intended to be used in addition to a healthy diet and exercise. The best results are achieved this way. This medicine is only indicated for short-term use. Eventually your weight loss may level out. At that point, the drug will only help you maintain your new weight. Do not increase or in any way change your dose without consulting your doctor. You may get drowsy or dizzy. Do not drive, use machinery, or do anything that needs mental alertness until you know how this medicine affects you. Do not stand or sit up quickly, especially if you are an older patient. This reduces the risk of dizzy or fainting spells. Alcohol may increase dizziness and drowsiness. Avoid alcoholic drinks. What side effects may I notice from receiving this medicine? Side effects that you should report to your doctor or health care professional as soon as possible: -chest pain, palpitations -depression or severe changes in mood -increased blood pressure -irritability -nervousness or restlessness -severe dizziness -shortness of breath -problems urinating -unusual swelling of the legs -vomiting Side effects that usually do not require medical attention (report to your doctor or health care professional if they continue or are bothersome): -blurred vision or other eye problems -changes in sexual ability or desire -constipation or diarrhea -difficulty sleeping -dry mouth or unpleasant taste -headache -nausea This list may not describe all possible side effects. Call your doctor for medical advice about side effects. You may report side effects to FDA at 1-800-FDA-1088. Where should I keep my medicine? Keep out of the reach of children. This medicine can be abused. Keep your medicine in a safe place to protect it from theft. Do not share this medicine with anyone. Selling or giving away this medicine  is dangerous and against the law. Store at room temperature between 20 and 25 degrees C (68 and 77 degrees F). Keep container tightly closed. Throw away any unused medicine after the expiration date. NOTE: This sheet is a summary. It may not cover all possible information. If you have questions about this medicine, talk to your doctor, pharmacist, or health care provider.  2015, Elsevier/Gold Standard. (2010-08-29 11:02:44)

## 2014-04-11 ENCOUNTER — Other Ambulatory Visit: Payer: Self-pay | Admitting: Family Medicine

## 2014-04-11 ENCOUNTER — Telehealth: Payer: Self-pay | Admitting: Medical

## 2014-04-11 DIAGNOSIS — E669 Obesity, unspecified: Secondary | ICD-10-CM

## 2014-04-11 NOTE — Telephone Encounter (Signed)
I called the patient to inform him of his appointment on 06/06/2014 @ 800 pm and he will leave Tuesday morning at 500 am. [phone] if needs to reschedule. CLS

## 2014-04-26 ENCOUNTER — Telehealth: Payer: Self-pay | Admitting: Medical

## 2014-04-26 ENCOUNTER — Encounter: Payer: Self-pay | Admitting: Medical

## 2014-04-26 ENCOUNTER — Ambulatory Visit (INDEPENDENT_AMBULATORY_CARE_PROVIDER_SITE_OTHER): Payer: PRIVATE HEALTH INSURANCE | Admitting: Medical

## 2014-04-26 VITALS — BP 110/70 | HR 84 | Temp 98.5°F | Resp 16 | Wt 270.0 lb

## 2014-04-26 DIAGNOSIS — J029 Acute pharyngitis, unspecified: Secondary | ICD-10-CM

## 2014-04-26 DIAGNOSIS — J309 Allergic rhinitis, unspecified: Secondary | ICD-10-CM

## 2014-04-26 DIAGNOSIS — G4710 Hypersomnia, unspecified: Secondary | ICD-10-CM

## 2014-04-26 DIAGNOSIS — G478 Other sleep disorders: Secondary | ICD-10-CM

## 2014-04-26 DIAGNOSIS — J0110 Acute frontal sinusitis, unspecified: Secondary | ICD-10-CM

## 2014-04-26 DIAGNOSIS — T50905A Adverse effect of unspecified drugs, medicaments and biological substances, initial encounter: Secondary | ICD-10-CM

## 2014-04-26 DIAGNOSIS — E669 Obesity, unspecified: Secondary | ICD-10-CM

## 2014-04-26 DIAGNOSIS — R400 Somnolence: Secondary | ICD-10-CM

## 2014-04-26 DIAGNOSIS — J342 Deviated nasal septum: Secondary | ICD-10-CM

## 2014-04-26 LAB — POCT RAPID STREP A (OFFICE): RAPID STREP A SCREEN: NEGATIVE

## 2014-04-26 MED ORDER — LORCASERIN HCL 10 MG PO TABS: 1.0000 | ORAL_TABLET | Freq: Two times a day (BID) | ORAL | Status: DC

## 2014-04-26 MED ORDER — MONTELUKAST SODIUM 10 MG PO TABS: 10.0000 mg | ORAL_TABLET | Freq: Every day | ORAL | Status: DC

## 2014-04-26 MED ORDER — AMOXICILLIN 500 MG PO TABS: ORAL_TABLET | ORAL | Status: DC

## 2014-04-26 NOTE — Telephone Encounter (Signed)
pls refer to [facility], preferably Dr. Barnetta ChapelWhelan.   Pls let him know if we can get earlier Sleep Study date.

## 2014-04-26 NOTE — Telephone Encounter (Signed)
Working on referral and sleep study the patient is on a waiting list.

## 2014-04-26 NOTE — Progress Notes (Signed)
Subjective: Here for several concerns.  Sleep issues - still waiting to hear back about sleep study.  States he hasn't gotten call about this.  .    Sinuses/allergies - since last visit he did separate trials of Qnasal and Nasonex and neither helped at all.  In addition to his normal allergy problems/congestion, is now having sinus infection.   He reports 1+ week of sinus pressure, thick nasal discharge, worse headache, ear pressure, horrible sore throat, lymph nodes swollen. In the past this has been his trend and symptoms when sinus infection begins.  Usually does fine on Amoxicillin.   At last visit he noted that he always feels congested.  He is a Surveyor, quantitymusician and singer.  Nasal spray didn't help (ipratropium).  Has had issues with nasal congestion x years.  Over the years has used Claritin, Flonase.  No prior allergy testing, no prior consult with allergist or ENT.  No prior broken nose or punch in the nose.  No itchy nose no itchy eyes.    Weight loss medication.  Qsymia was going to cost almost $300.  In the gym walking and running daily.  Tries to eat healthy, eats fast food 2 times per week, all other is home cooked meals, salad at least once daily.  After last visit begin Phentermine, and immediately got headaches.  He went ahead and started Topamax too, and for 3 weeks straight he stuck to taking both medication although the Phentermine seems to give him headaches shortly after taking the medication.   He finally has stopped both medications and actually gained 5 lb since last visit, and the phentermine didn't seem to curb appetite at all.  No other aggravating or relieving factors.   Objective: BP 110/70  Pulse 84  Temp(Src) 98.5 F (36.9 C) (Oral)  Resp 16  Wt 270 lb (122.471 kg)  General appearance: alert, no distress, WD/WN, AA male HEENT: normocephalic, sclerae anicteric, TMs flat, nares with bilat turbinate edema, septum deviates to the right, mucoid discharge present, pharynx with  erythema Oral cavity: MMM, no lesions Neck: supple, shoddy tender anterior lymph nodes, no thyromegaly, no masses Heart: RRR, normal S1, S2, no murmurs Lungs: CTA bilaterally, no wheezes, rhonchi, or rales Pulses: 2+ symmetric, upper and lower extremities, normal cap refill Ext: no edema   Assessment: Encounter Diagnoses  Name Primary?  . Acute frontal sinusitis, recurrence not specified Yes  . Sore throat   . Allergic rhinitis, unspecified allergic rhinitis type   . Deviated nasal septum   . Non-restorative sleep   . Daytime somnolence   . Obesity, unspecified   . Adverse effect of drug/medicinal, initial encounter    Plan: We discussed his concerns and next steps.     Nasal congestion, allergic rhinitis - failed Ipratropium nasal, Qnasal spray, Flonase nasal and Nasonex.  Failed oral OTC antihistamines.   Begin trial of Singulair.  Referral to allergist, and advised to stop ALL antihistamines >72 from allergist appt  Sinusitis - begin amoxicillin, c/t nasal saline flush, rest, hydrate well.  Weight loss - Qsymia too expensive.  Had to stop Phentermine due to headaches.  Begin trial of Belviq.  Discussed risks/benefits of the medication.  C/t effort at diet and exercise as discussed.   Gave him the appt info for sleep study, [facility]

## 2014-06-06 ENCOUNTER — Ambulatory Visit (HOSPITAL_BASED_OUTPATIENT_CLINIC_OR_DEPARTMENT_OTHER): Payer: Self-pay | Attending: Medical

## 2014-06-06 VITALS — Ht 74.75 in | Wt 280.0 lb

## 2014-06-06 DIAGNOSIS — Z9989 Dependence on other enabling machines and devices: Secondary | ICD-10-CM

## 2014-06-06 DIAGNOSIS — Z6835 Body mass index (BMI) 35.0-35.9, adult: Secondary | ICD-10-CM | POA: Insufficient documentation

## 2014-06-06 DIAGNOSIS — G4710 Hypersomnia, unspecified: Secondary | ICD-10-CM | POA: Insufficient documentation

## 2014-06-06 DIAGNOSIS — G4733 Obstructive sleep apnea (adult) (pediatric): Secondary | ICD-10-CM

## 2014-06-06 DIAGNOSIS — G4730 Sleep apnea, unspecified: Secondary | ICD-10-CM | POA: Insufficient documentation

## 2014-06-06 DIAGNOSIS — E669 Obesity, unspecified: Secondary | ICD-10-CM

## 2014-06-11 DIAGNOSIS — G4733 Obstructive sleep apnea (adult) (pediatric): Secondary | ICD-10-CM

## 2014-06-11 DIAGNOSIS — E669 Obesity, unspecified: Secondary | ICD-10-CM

## 2014-06-11 NOTE — Sleep Study (Signed)
   NAME: Paul Gamble DATE OF BIRTH:  [birth year] MEDICAL RECORD NUMBER [id number]  LOCATION: [facility]  PHYSICIAN: Addalee Kavanagh D  DATE OF STUDY: 06/06/2014  SLEEP STUDY TYPE: Nocturnal Polysomnogram               REFERRING PHYSICIAN: Jac Canavanysinger, David S, PA-C  INDICATION FOR STUDY: hypersomnia with sleep apnea  EPWORTH SLEEPINESS SCORE:  7/24 HEIGHT: 6' 2.75" (189.9 cm)  WEIGHT: 280 lb (127.007 kg)    Body mass index is 35.22 kg/(m^2).  NECK SIZE: 17.5 in.  MEDICATIONS: charted for review  SLEEP ARCHITECTURE: split study protocol. During the diagnostic phase, total sleep time 130 minutes with sleep efficiency 93.2%. Stage I was 6.5%, stage II 93.5%, stage III and REM were absent. Sleep latency 4.5 minutes, awake after sleep onset 5 minutes, arousal index 3.2, bedtime medication: None  RESPIRATORY DATA: apnea hypopnea index (AHI) 38.8 per hour. 84 total events scored including 6 obstructive apneas and 7 hypopneas. Events were not positional. REM was absent. CPAP titration to 10 CWP, AHI 1.6 per hour. He wore a medium fullface mask.  OXYGEN DATA: moderate snoring before CPAP with oxygen desaturation to a nadir of 82% on room air. With CPAP control, snoring was prevented and mean oxygen saturation was 94.5%.  CARDIAC DATA: sinus rhythm with PVCs  MOVEMENT/PARASOMNIA: no significant movement disturbance, bathroom 1  IMPRESSION/ RECOMMENDATION:   1) Severe obstructive sleep apnea/hypopnea syndrome, AHI 38.8 per hour with non-positional events. Moderate snoring with oxygen desaturation to a nadir of 82% on room air. 2) Successful CPAP titration to 10 CWP, AHI 1.6 per hour. He wore a medium [employer] Simplus fullface mask with heated humidifier and an EPR of 2 . Snoring was prevented and mean oxygen saturation was 94.5%.  Waymon BudgeYOUNG,Deeana Atwater D Diplomate, American Board of Sleep Medicine  ELECTRONICALLY SIGNED ON:  06/11/2014, 1:17 PM [facility] PH: [phone]   FX: [phone] ACCREDITED BY THE AMERICAN ACADEMY OF SLEEP MEDICINE

## 2014-06-14 ENCOUNTER — Telehealth: Payer: Self-pay | Admitting: Medical

## 2014-06-14 NOTE — Telephone Encounter (Signed)
LMOM TO CB. CLS 

## 2014-06-14 NOTE — Telephone Encounter (Signed)
Paul Gamble - his sleep study shows severe sleep apnea.   This may be a major factor in his collection of symptoms .  He also was titrated on CPAP.  Thus, please make home health referral for CPAP and supplies.  Use [facility] or [facility], whichever is  the newer group we have been working with right down the street.

## 2014-06-15 NOTE — Telephone Encounter (Signed)
LMOM TO CB. CLS 

## 2014-06-16 NOTE — Telephone Encounter (Signed)
Interestingly, I addressed his numerous concerns at each visit, sometimes going over the time allotted, he has had difficult to treat nasal congestion issues which we ended up referring to allergist, and we have have done appropriate care.  Not sure what he means by "up and down," but I also want people to feel comfortable and trusting. So if he wants to see Dr. Susann GivensLalonde instead, then let him do follow up with him.  If he is frustrated mainly with the nasal congestion issue,which has been difficult to treat, and that was just as frustrating to me to not see success then I understand.  When we are faced with this type of thing, we get specialists involved, hence the allergist referral.

## 2014-06-16 NOTE — Telephone Encounter (Signed)
So I have called the patient to let him know that he can see Dr. Susann GivensLalonde. Pt states that he has seen an allergist already not that he needed too. He has a lot of frustration of how he came in here in may and had already done a lot of research prior to coming in, so he tells shane what he thinks is going on with him. He had been having confusion and was tired all the time and he knew it was sleep apnea but nothing had been done til just now and got scheduled for a sleep study 11/9 and just got the results, also pt had nasal drip and congestion back in may and shane told him that nothing was wrong and it was just his allergies and he needed to go see an allergist when he told Vincenza HewsShane he needed to see an ENT that he knew something was wrong but shane didn't listen. When patient came back in another day, shane then advised him that his septum was deviated and to get referred to a ENT for that. When he went to ENT he wasn't even there 5 minutes with the Doctor and the Doctor told him he had to have surgery. Pt is just very upset and frustrated as he had come here in may and was getting referred out and spending money and was told it was just allergies and here take meds and it should clear it up when really it was a deviated septum. Pt has spent copay after copay and specialist copay after copay when he told shane in May what he wanted and needed. Now he does not have the money to pay for surgery cause he was told that it would cost $750 up front before surgery and insurance wouldn't kick in til then. He wanted to see a Doctor to get referred to the right place so it can get filed under insurance. Pt states he will decide he if wants to stay here as a pt and see Dr. Susann GivensLalonde and let us know within the next week and half.

## 2014-06-16 NOTE — Telephone Encounter (Signed)
Dr. Susann GivensLalonde before i call and speak with this patient, pt would like to see you per previous notes. Is this okay

## 2014-06-16 NOTE — Telephone Encounter (Signed)
Have him follow-up with me after he sees the allergist

## 2014-06-16 NOTE — Telephone Encounter (Signed)
Vincenza HewsShane , this patient would like to know if he could see Dr. Susann GivensLalonde. He said that you was to up and down for him and he would rather see a doctor than a PA. I was told I had to ask you if this was okay with you first then I can ask Dr. Susann GivensLalonde.Please, advise

## 2014-06-16 NOTE — Telephone Encounter (Signed)
I guess we had a miscommunication from the get go.   I did discuss and address his concern for sleep apnea during a comprehensive physical back in May.   We can spend a lot of time going back through each e-mail, visit, correspondence and how we got to this point, but not sure it would make much difference.  Just have him f/u with Dr. Susann GivensLalonde.    (if you or he cares to, can go back through the correspondence on how we addressed sleep apnea starting with May 2015 - or if needed, have Lafonda MossesDiana talk with me to address this more thoroughly)

## 2014-06-16 NOTE — Telephone Encounter (Signed)
I notified the patient of his sleep study results and what the next steps would be. I advised him that someone from [facility] would be contacting him to set up an appointment to bring out his CPAP machine and supplies.

## 2014-06-17 NOTE — Telephone Encounter (Signed)
I called over to [facility] this morning and the Rep. There states that they did receive the referral and they are working on his CPAP and supplies and that someone would get in contact with him either later on today or Monday 06/20/14. I called the patient to make him aware that they have his paper work and they are working on it and will call him today or on Monday 06/20/14

## 2014-06-17 NOTE — Telephone Encounter (Signed)
Patient is aware of Paul CoveyShane Tysinger [facility] message and he said okay and had no other response or commits.

## 2014-06-27 ENCOUNTER — Telehealth: Payer: Self-pay | Admitting: Family Medicine

## 2014-06-27 NOTE — Telephone Encounter (Signed)
Patient called checking on the status of his CPAP machine order through [facility]. He states that he hasn't heard from anyone as of today. I called over to [facility] and spoke with the REp. And she states that they tried to call Paul Gamble on 06/17/14 and left a voicemail about his insurance problems. I called the patient back and gave him the message that they did try to call him on 06/17/14. Patient states that no one has tried to call him nor did they leave him a message. I explain to him that I can only go by what they told me. I gave him the telephone number to [facility] so that he could return there phone. Patient spoke with Paul Gamble at first and was very upset.

## 2014-06-27 NOTE — Telephone Encounter (Signed)
I called [facility] [phone] to check to see if the patient had called them back to check on CPAP machine. The REP. Told me that he has not called them back yet. I ask the lady if she could please try and get in touch with again. The REP. Told me that she would try and give him another call.

## 2014-06-29 ENCOUNTER — Telehealth: Payer: Self-pay | Admitting: Family Medicine

## 2014-06-29 NOTE — Telephone Encounter (Signed)
I reviewed pt chart before returning call to patient [phone].  I see lag time which is obviously causing frustration.  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

## 2014-08-05 ENCOUNTER — Encounter (HOSPITAL_COMMUNITY): Payer: Self-pay

## 2014-08-05 ENCOUNTER — Emergency Department (HOSPITAL_COMMUNITY)
Admission: EM | Admit: 2014-08-05 | Discharge: 2014-08-05 | Disposition: A | Discharge status: Home / Self Care | Payer: Commercial Managed Care - PPO | Attending: Emergency Medicine | Admitting: Emergency Medicine

## 2014-08-05 DIAGNOSIS — Z87891 Personal history of nicotine dependence: Secondary | ICD-10-CM | POA: Insufficient documentation

## 2014-08-05 DIAGNOSIS — Z87442 Personal history of urinary calculi: Secondary | ICD-10-CM | POA: Diagnosis not present

## 2014-08-05 DIAGNOSIS — E669 Obesity, unspecified: Secondary | ICD-10-CM | POA: Diagnosis not present

## 2014-08-05 DIAGNOSIS — L089 Local infection of the skin and subcutaneous tissue, unspecified: Secondary | ICD-10-CM

## 2014-08-05 DIAGNOSIS — L03116 Cellulitis of left lower limb: Secondary | ICD-10-CM | POA: Insufficient documentation

## 2014-08-05 MED ORDER — CLINDAMYCIN HCL 150 MG PO CAPS: 300.0000 mg | ORAL_CAPSULE | Freq: Three times a day (TID) | ORAL | Status: AC

## 2014-08-05 NOTE — ED Notes (Signed)
Pt complains of an abcessed area on his left lower leg

## 2014-08-05 NOTE — ED Provider Notes (Signed)
CSN: [id number]     Arrival date & time 08/05/14  0020 History   First MD Initiated Contact with Patient 08/05/14 0121     Chief Complaint  Patient presents with  . Recurrent Skin Infections    (Consider location/radiation/quality/duration/timing/severity/associated sxs/prior Treatment) HPI Comments: Patient is a 29 year old male with no significant past medical history who presents to the emergency department for pain to his anterior left lower extremity 5 days. Patient states that he has noticed worsening swelling to his anterior left lower leg. He states that pain began today. Patient reports that he crossed his legs at work and bumped the area causing onset of his pain. He states that he has noted some pale redness to the area. He believes he was bit by a spider 5 days ago causing the associated swelling. No medications taken prior to arrival. Patient denies associated fever, red streaking, drainage, sensation loss, a history of MRSA, or history of abscesses.  The history is provided by the patient. No language interpreter was used.    Past Medical History  Diagnosis Date  . Renal stones 2011    with emergency lithotripsy  . Allergic rhinitis   . Obesity    Past Surgical History  Procedure Laterality Date  . Lithotripsy    . Wisdom tooth extraction     Family History  Problem Relation Age of Onset  . Heart disease Mother   . Hypertension Mother   . Cancer Maternal Grandmother     breast  . Stroke Maternal Grandmother   . Cancer Maternal Grandfather     lung  . COPD Maternal Grandfather   . Stroke Maternal Grandfather   . Diabetes Neg Hx    History  Substance Use Topics  . Smoking status: Former Games developermoker  . Smokeless tobacco: Not on file  . Alcohol Use: 0.6 oz/week    1 Shots of liquor per week     Comment: occasionally    Review of Systems  Constitutional: Negative for fever.  Musculoskeletal: Positive for myalgias.  Skin: Positive for color change. Negative for  pallor.  Neurological: Negative for numbness.  All other systems reviewed and are negative.   Allergies  Phentermine  Home Medications   Prior to Admission medications   Medication Sig Start Date End Date Taking? Authorizing Provider  guaiFENesin (ROBITUSSIN) 100 MG/5ML liquid Take 200 mg by mouth 3 (three) times daily as needed for cough.   Yes Historical Provider, MD  amoxicillin (AMOXIL) 500 MG tablet 2 tablets po BID x 10 days Patient not taking: Reported on 08/05/2014 04/26/14   Kermit Baloavid S Tysinger, PA-C  clindamycin (CLEOCIN) 150 MG capsule Take 2 capsules (300 mg total) by mouth 3 (three) times daily. May dispense as 150mg  capsules 08/05/14   Antony MaduraKelly Ruthvik Barnaby, PA-C  Lorcaserin HCl 10 MG TABS Take 1 tablet by mouth 2 (two) times daily. Patient not taking: Reported on 08/05/2014 04/26/14   Kermit Baloavid S Tysinger, PA-C  montelukast (SINGULAIR) 10 MG tablet Take 1 tablet (10 mg total) by mouth at bedtime. Patient not taking: Reported on 08/05/2014 04/26/14   Kermit Baloavid S Tysinger, PA-C   BP 111/58 mmHg  Pulse 100  Temp(Src) 98.7 F (37.1 C) (Oral)  Resp 20  Ht 6\' 2"  (1.88 m)  Wt 275 lb (124.739 kg)  BMI 35.29 kg/m2  SpO2 95%   Physical Exam  Constitutional: He is oriented to person, place, and time. He appears well-developed and well-nourished. No distress.  Nontoxic/nonseptic appearing.  HENT:  Head: Normocephalic and  atraumatic.  Eyes: Conjunctivae and EOM are normal. No scleral icterus.  Neck: Normal range of motion.  Cardiovascular: Normal rate, regular rhythm and intact distal pulses.   DP and PT pulses 2+ in LLE  Pulmonary/Chest: Effort normal. No respiratory distress.  Respirations even and unlabored  Musculoskeletal: Normal range of motion. He exhibits tenderness.       Legs: TTP to anterior L lower extremity at site of pustule  Neurological: He is alert and oriented to person, place, and time. He exhibits normal muscle tone. Coordination normal.  Sensation to light touch intact. Patient  ambulatory with normal gait.  Skin: Skin is warm and dry. No rash noted. He is not diaphoretic. No erythema. No pallor.  Pustule to anterior LLE with mild induration, extending approximately 1cm from center circumferentially. Mild erythema without marked heat to touch. No drainage or weeping.  Psychiatric: He has a normal mood and affect. His behavior is normal.  Nursing note and vitals reviewed.   ED Course  Procedures (including critical care time) Labs Review Labs Reviewed - No data to display  Imaging Review EMERGENCY DEPARTMENT US SOFT TISSUE INTERPRETATION "Study: Limited Soft Tissue Ultrasound"  INDICATIONS: Pain Multiple views of the body part were obtained in real-time with a multi-frequency linear probe PERFORMED BY:  Myself IMAGES ARCHIVED?: Yes SIDE:Left BODY PART:Lower extremity FINDINGS: Other - Very small abscess with surrounding soft tissue swelling INTERPRETATION:  Abcess present and Cellulitis present; see above.   EKG Interpretation None     INCISION AND DRAINAGE Performed by: Antony Madura Consent: Verbal consent obtained. Risks and benefits: risks, benefits and alternatives were discussed Type: abscess  Body area: anterior LLE  Anesthesia: none  Incision was made with a 18 gauge needle  Local anesthetic: none  Anesthetic total: n/a  Complexity: simple  Drainage: purulent  Drainage amount: scant  Packing material: none  Patient tolerance: Patient tolerated the procedure well with no immediate complications.    MDM   Final diagnoses:  Pustule  Left leg cellulitis    29 year old male presents to the emergency department for further evaluation of pain to his left anterior lower extremity. Patient believes his symptoms are secondary to a spider bite, but denies seeing a spider or feeling a bug/spider bite him. Physical exam findings consistent with pustule with surrounding induration. There is, what appears to be, a small purulent pocket  noted on ultrasound with surrounding soft tissue swelling. Pustule incised with end of 18 gauge needle and scant amount of purulence expelled. Will place patient on clindamycin to cover for mild cellulitis. Have also advised warm compresses. Primary care follow-up for recheck advised in 72 hours. Return precautions provided inpatient agreeable to plan with no unaddressed concerns.   Filed Vitals:   08/05/14 0027  BP: 111/58  Pulse: 100  Temp: 98.7 F (37.1 C)  TempSrc: Oral  Resp: 20  Height:  (1.88 m)  Weight: 275 lb (124.739 kg)  SpO2: 95%     Antony Madura, PA-C 08/05/14 0241  Tomasita Crumble, MD 08/05/14 [phone]

## 2014-08-05 NOTE — Discharge Instructions (Signed)
Apply warm wet compresses 3-4 times per day. Take Clindamycin as prescribed. Follow up with your doctor for a recheck of the wound. Take ibuprofen 600mg  every 6 hours as needed for pain.  Abscess Care After An abscess (also called a boil or furuncle) is an infected area that contains a collection of pus. Signs and symptoms of an abscess include pain, tenderness, redness, or hardness, or you may feel a moveable soft area under your skin. An abscess can occur anywhere in the body. The infection may spread to surrounding tissues causing cellulitis. A cut (incision) by the surgeon was made over your abscess and the pus was drained out. Gauze may have been packed into the space to provide a drain that will allow the cavity to heal from the inside outwards. The boil may be painful for 5 to 7 days. Most people with a boil do not have high fevers. Your abscess, if seen early, may not have localized, and may not have been lanced. If not, another appointment may be required for this if it does not get better on its own or with medications. HOME CARE INSTRUCTIONS   Only take over-the-counter or prescription medicines for pain, discomfort, or fever as directed by your caregiver.  When you bathe, soak and then remove gauze or iodoform packs at least daily or as directed by your caregiver. You may then wash the wound gently with mild soapy water. Repack with gauze or do as your caregiver directs. SEEK IMMEDIATE MEDICAL CARE IF:   You develop increased pain, swelling, redness, drainage, or bleeding in the wound site.  You develop signs of generalized infection including muscle aches, chills, fever, or a general ill feeling.  An oral temperature above 102 F (38.9 C) develops, not controlled by medication. See your caregiver for a recheck if you develop any of the symptoms described above. If medications (antibiotics) were prescribed, take them as directed. Document Released: 01/31/2005 Document Revised:  10/07/2011 Document Reviewed: 09/28/2007 [facility] Patient Information 2015 [employer] Ruiz[employer]Care, [place]. This information is not intended to replace advice given to you by your health care provider. Make sure you discuss any questions you have with your health care provider.

## 2014-08-05 NOTE — ED Notes (Signed)
Pt presents with an abscess in his L shin since Monday.  Denies any drainage at this time.  Redness and swelling noted.

## 2015-06-30 ENCOUNTER — Encounter (HOSPITAL_COMMUNITY): Payer: Self-pay | Admitting: Emergency Medicine

## 2015-06-30 ENCOUNTER — Emergency Department (HOSPITAL_COMMUNITY)
Admission: EM | Admit: 2015-06-30 | Discharge: 2015-06-30 | Disposition: A | Discharge status: Home / Self Care | Payer: 59 | Attending: Emergency Medicine | Admitting: Emergency Medicine

## 2015-06-30 DIAGNOSIS — Z792 Long term (current) use of antibiotics: Secondary | ICD-10-CM | POA: Insufficient documentation

## 2015-06-30 DIAGNOSIS — Y9289 Other specified places as the place of occurrence of the external cause: Secondary | ICD-10-CM | POA: Insufficient documentation

## 2015-06-30 DIAGNOSIS — X58XXXA Exposure to other specified factors, initial encounter: Secondary | ICD-10-CM | POA: Insufficient documentation

## 2015-06-30 DIAGNOSIS — N200 Calculus of kidney: Secondary | ICD-10-CM | POA: Insufficient documentation

## 2015-06-30 DIAGNOSIS — E669 Obesity, unspecified: Secondary | ICD-10-CM | POA: Insufficient documentation

## 2015-06-30 DIAGNOSIS — F17210 Nicotine dependence, cigarettes, uncomplicated: Secondary | ICD-10-CM | POA: Insufficient documentation

## 2015-06-30 DIAGNOSIS — Y9389 Activity, other specified: Secondary | ICD-10-CM | POA: Insufficient documentation

## 2015-06-30 DIAGNOSIS — S7402XA Injury of sciatic nerve at hip and thigh level, left leg, initial encounter: Secondary | ICD-10-CM

## 2015-06-30 DIAGNOSIS — Y998 Other external cause status: Secondary | ICD-10-CM | POA: Insufficient documentation

## 2015-06-30 MED ORDER — HYDROCODONE-ACETAMINOPHEN 5-325 MG PO TABS: 1.0000 | ORAL_TABLET | Freq: Four times a day (QID) | ORAL | Status: AC | PRN

## 2015-06-30 MED ORDER — PREDNISONE 20 MG PO TABS
60.0000 mg | ORAL_TABLET | Freq: Once | ORAL | Status: AC
  Administered 2015-06-30: 60 mg via ORAL
  Filled 2015-06-30: qty 3

## 2015-06-30 MED ORDER — HYDROCODONE-ACETAMINOPHEN 5-325 MG PO TABS
1.0000 | ORAL_TABLET | ORAL | Status: AC
  Administered 2015-06-30: 1 via ORAL
  Filled 2015-06-30: qty 1

## 2015-06-30 MED ORDER — METHOCARBAMOL 750 MG PO TABS: 750.0000 mg | ORAL_TABLET | Freq: Three times a day (TID) | ORAL | Status: AC | PRN

## 2015-06-30 MED ORDER — METHOCARBAMOL 500 MG PO TABS
750.0000 mg | ORAL_TABLET | Freq: Once | ORAL | Status: AC
  Administered 2015-06-30: 750 mg via ORAL
  Filled 2015-06-30: qty 2

## 2015-06-30 MED ORDER — PREDNISONE 20 MG PO TABS: ORAL_TABLET | ORAL | Status: AC

## 2015-06-30 NOTE — ED Provider Notes (Signed)
CSN: [id number]     Arrival date & time 06/30/15  0118 History   First MD Initiated Contact with Patient 06/30/15 0140     Chief Complaint  Patient presents with  . 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  It momentarily stunned him, but he was able to continue working since that time.  It has been painful.  Cerner positions.  Aggravated.  He has tried topical ointments including lidocaine patch icy hot, hot and cold compresses, all with little relief.  He denies any numbness or tingling, loss of function.  No bowel, incontinence, no decreased erections, no peritoneal numbness or decreased sensation  The history is provided by the patient.    Past Medical History  Diagnosis Date  . Renal stones 2011    with emergency lithotripsy  . Allergic rhinitis   . Obesity    Past Surgical History  Procedure Laterality Date  . Lithotripsy    . Wisdom tooth extraction     Family History  Problem Relation Age of Onset  . Heart disease Mother   . Hypertension Mother   . Cancer Maternal Grandmother     breast  . Stroke Maternal Grandmother   . Cancer Maternal Grandfather     lung  . COPD Maternal Grandfather   . Stroke Maternal Grandfather   . Diabetes Neg Hx    Social History  Substance Use Topics  . Smoking status: Light Tobacco Smoker    Types: Cigars  . Smokeless tobacco: None  . Alcohol Use: 0.6 oz/week    1 Shots of liquor per week     Comment: occasionally    Review of Systems  Constitutional: Negative for fever and chills.  Gastrointestinal: Negative for abdominal pain.  Genitourinary: Negative for dysuria, urgency, decreased urine volume and difficulty urinating.  Musculoskeletal: Positive for back pain and arthralgias.    Neurological: Negative for weakness and numbness.  All other systems reviewed and are negative.     Allergies  Phentermine  Home Medications   Prior to Admission medications   Medication Sig Start Date End Date Taking? Authorizing Provider  amoxicillin (AMOXIL) 500 MG tablet 2 tablets po BID x 10 days Patient not taking: Reported on 08/05/2014 04/26/14   Kermit Balo Tysinger, PA-C  clindamycin (CLEOCIN) 150 MG capsule Take 2 capsules (300 mg total) by mouth 3 (three) times daily. May dispense as  capsules 08/05/14   Antony Madura, PA-C  guaiFENesin (ROBITUSSIN) 100 MG/5ML liquid Take 200 mg by mouth 3 (three) times daily as needed for cough.    Historical Provider, MD  HYDROcodone-acetaminophen (NORCO/VICODIN) 5-325 MG tablet Take 1 tablet by mouth every 6 (six) hours as needed for moderate pain. 06/30/15   Earley Favor, NP  Lorcaserin HCl 10 MG TABS Take 1 tablet by mouth 2 (two) times daily. Patient not taking: Reported on 08/05/2014 04/26/14   Kermit Balo Tysinger, PA-C  methocarbamol (ROBAXIN) 750 MG tablet Take 1 tablet (750 mg total) by mouth every 8 (eight) hours as needed for muscle spasms. 06/30/15   Earley Favor, NP  montelukast (SINGULAIR) 10 MG tablet Take 1 tablet (10 mg total) by mouth at bedtime. Patient not taking: Reported on 08/05/2014 04/26/14   Kermit Balo Tysinger, PA-C  predniSONE (DELTASONE) 20 MG tablet 3 Tabs PO  Days 1-3, then 2 tabs PO Days 4-6, then 1 tab PO Day 7-9, then Half Tab PO Day 10-12 06/30/15   Earley FavorGail Towanda Hornstein, NP   BP 135/91 mmHg  Pulse 84  Temp(Src) 98.1 F (36.7 C) (Oral)  Resp 18  SpO2 98% Physical Exam  Constitutional: He appears well-developed and well-nourished.  HENT:  Head: Normocephalic.  Eyes: Pupils are equal, round, and reactive to light.  Neck: Normal range of motion.  Cardiovascular: Normal rate.   Pulmonary/Chest: Effort normal.  Abdominal: Soft.  Musculoskeletal: Normal range of motion. He exhibits no edema.       Back:  Neurological: He is alert.   Skin: Skin is warm.  Nursing note and vitals reviewed.   ED Course  Procedures (including critical care time) Labs Review Labs Reviewed - No data to display  Imaging Review No results found. I have personally reviewed and evaluated these images and lab results as part of my medical decision-making.   EKG Interpretation None     Will start patient on steroid taper muscle relaxer  MDM   Final diagnoses:  Sciatic nerve injury, left, initial encounter         Earley FavorGail Garvin Ellena, NP 06/30/15 [phone]  Shon Batonourtney F Horton, MD 06/30/15 [phone]

## 2015-06-30 NOTE — ED Notes (Signed)
Patient /co low back pain that started 8 days ago. Patient states the pain is now on the left side and radiates down his leg. Patient using icy hot, lidocaine patches, and heating pad without relief. Patient has ordered a tens unit but it has not arrived yet.

## 2015-06-30 NOTE — Discharge Instructions (Signed)
°Emergency Department Resource Guide °1) Find a Doctor and Pay Out of Pocket °Although you won't have to find out who is covered by your insurance plan, it is a good idea to ask around and get recommendations. You will then need to call the office and see if the doctor you have chosen will accept you as a new patient and what types of options they offer for patients who are self-pay. Some doctors offer discounts or will set up payment plans for their patients who do not have insurance, but you will need to ask so you aren't surprised when you get to your appointment. ° °2) Contact Your Local Health Department °Not all health departments have doctors that can see patients for sick visits, but many do, so it is worth a call to see if yours does. If you don't know where your local health department is, you can check in your phone book. The CDC also has a tool to help you locate your state's health department, and many state websites also have listings of all of their local health departments. ° °3) Find a Walk-in Clinic °If your illness is not likely to be very severe or complicated, you may want to try a walk in clinic. These are popping up all over the country in pharmacies, drugstores, and shopping centers. They're usually staffed by nurse practitioners or physician assistants that have been trained to treat common illnesses and complaints. They're usually fairly quick and inexpensive. However, if you have serious medical issues or chronic medical problems, these are probably not your best option. ° °No Primary Care Doctor: °- Call [facility] at  832-8000 - they can help you locate a primary care doctor that  accepts your insurance, provides certain services, etc. °- Physician Referral Service- 1-800-533-3463 ° °Chronic Pain Problems: °Organization         Address  Phone   Notes  °[facility]  (336) 297-2271 Patients need to be referred by their primary care doctor.  ° °Medication  Assistance: °Organization         Address  Phone   Notes  °[place] [facility] [address] °[place], [place] [address] (336) 641-8030 --Must be a resident of [place] °-- Must have NO insurance coverage whatsoever (no Medicaid/ Medicare, etc.) °-- The pt. MUST have a primary care doctor that directs their care regularly and follows them in the community °  °[facility]  (866) 331-1348   °[employer]  (888) 892-1162   ° °Agencies that provide inexpensive medical care: °Organization         Address  Phone   Notes  °[facility]  (336) 832-8035   °[facility]    (336) 832-7272   °[facility] [address] °[place], [place] [address] (336) 832-4777   °[facility] [address], °[place] (336) 271-4999   °[facility]    (336) 373-0678   °[facility]    (336) 272-1050   °[facility] ° [address], [place] Phone:  (336) 832-4444, Fax:  (336) 832-4440 Hours of Operation:  9 am - 6 pm, M-F.  Also accepts Medicaid/Medicare and self-pay.  °[facility] ° [address], [place] Phone: (336) 832-3150, Fax: (336) 832-3151. Hours of Operation:  8:30 am - 5:30 pm, M-F.  Also accepts Medicaid and self-pay.  °[facility] [place] [address], [place] Phone: (336) 878-6027   °[facility] [address], [place], [place] (336)723-1848, Ext. 123 Mondays & Thursdays: 7-9 AM.  First 15 patients are seen on a first come, first serve basis. °  ° °Medicaid-accepting [place] Providers: ° °Organization         Address  Phone   Notes  °[facility] [address], [place] (336) 641-2100 Also accepts self-pay patients.  °[facility] [address], [place] ° (336) 856-9996   °[facility] [address], [place]  (336) 288-8857   °[facility] [address] [place] [address], [place] (336) 299-7000   °Veita Bland [address], [place]  ° (336) 373-1557 Only accepts [place] Access Medicaid patients after they have their name applied to their card.  ° °Self-Pay (no insurance) in [place]: ° °Organization         Address  Phone   Notes  °Sickle Cell Patients, [facility] [address], [place] (336) 832-1970   °[facility] [address], [place] (336) 832-4400   °[facility] ° [address], [place] (336) 992-4800   °[facility]/Dr. Osei-Bonsu ° [address] [place] [address], [place] or [address], [place] (336) 841-8500 Phone number for both [place] and [place] locations is the same.  °[facility] [address], [place] (336) 299-0000   °[facility] [address] [place] [address], [place] or [address] (336) 852-7530 °(336) 878-2260   °[facility] [address], [place] (336) 350-1642, phone; (336) 294-5005, fax Sees patients 1st and 3rd Saturday of every month.  Must not qualify for public or private insurance (i.e. Medicaid, Medicare, [place] Health Choice, Veterans' Benefits) • Household income should be no more than 200% of the poverty level •The clinic cannot treat you if you are pregnant or think you are pregnant • Sexually transmitted diseases are not treated at the clinic.  ° ° °Dental Care: °Organization         Address  Phone  Notes  °[place] [facility] [address], [place] (336) 641-6152 Accepts children up to age 21 who are enrolled in Medicaid or [place] Health Choice; pregnant women with a Medicaid card; and children who have applied for Medicaid or [place] Health Choice, but were declined, whose parents can pay a reduced fee at time of service.  °[place]  [facility] [place]  [address], [place] (336) 641-7733 Accepts children up to age 21 who are enrolled in Medicaid or [place] Health Choice; pregnant women with a Medicaid card; and children who have applied for Medicaid or [place] Health Choice, but were declined, whose parents can pay a reduced fee at time of service.  °[facility] ° [address],  (336) 641-4533 Patients are seen by appointment only. Walk-ins are not accepted. [facility] will see patients 18 years of age and older. °Monday - Tuesday (8am-5pm) °Most Wednesdays (8:30-5pm) °$30 per visit, cash only  °[facility] ° [address], [place] (336) 641-4533 Patients are seen by appointment only. Walk-ins are not accepted. [facility] will see patients 18 years of age and older. °One   Wednesday Evening (Monthly: Volunteer Based).  $30 per visit, cash only  °[facility]  (919) 537-3737 for adults; Children under age 4, call Graduate Pediatric Dentistry at (919) [phone]. Children aged 4-14, please call (919) [phone] to request a pediatric application. ° Dental services are provided in all areas of dental care including fillings, crowns and bridges, complete and partial dentures, implants, gum treatment, root canals, and extractions. Preventive care is also provided. Treatment is provided to both adults and children. °Patients are selected via a lottery and there is often a waiting list. °  °[facility] [address], °[place] ° (336) 763-8833 [email or web address] °  °[facility] [address], [place], [place] (336)723-1848, Ext. 123 Second and Fourth Thursday of each month, opens at 6:30 AM; Clinic ends at 9 AM.  Patients are seen on a first-come first-served basis, and a limited number are seen during each clinic.  ° °[facility] ° [address], [place], [place] (336) 723-7904    Eligibility Requirements °You must have lived in [place], [place], or [place] counties for at least the last three months. °  You cannot be eligible for state or federal sponsored healthcare insurance, including Veterans Administration, Medicaid, or Medicare. °  You generally cannot be eligible for healthcare insurance through your employer.  °  How to apply: °Eligibility screenings are held every Tuesday and Wednesday afternoon from 1:00 pm until 4:00 pm. You do not need an appointment for the interview!  °[facility] [address], [place], [place] 336-631-2330   °[facility] [facility]  336-342-8273   °[place] [facility]  336-703-3100   °[facility]  336-570-6415   ° °Behavioral Health Resources in the Community: °Intensive Outpatient Programs °Organization         Address  Phone  Notes  °[facility] [address], [place], [place] 336-878-6098   °[facility] [address], [place], [place] 336-832-9800   °[facility] [address], [place], [place] ° 336-882-2125   °[facility] [address],  °[place], [place] 1-800-853-5163 or 336-641-4981   °Substance Abuse Resources °Organization         Address  Phone  Notes  °[facility]  336-882-2125   °[facility]  336-784-9470   °[facility]  336-285-9073   °[facility]  336-845-3988   °Residential & Outpatient Substance Abuse Program  1-800-659-3381   °Psychological Services °Organization         Address  Phone  Notes  °[facility]  336- 832-9600   °[facility]  336- 378-7881   °[facility] [address], [place] 1-800-853-5163 or 336-641-4981   ° °Mobile Crisis Teams °Organization         Address  Phone  Notes  °[facility], Mobile Crisis Care Unit  1-877-626-1772   °Assertive °[facility] ° [address]  [place], [place] 336-834-9664   °Sharon DeEsch [address] °[place] 336-554-5454   ° °Self-Help/Support Groups °Organization         Address  Phone             Notes  °[facility] - variety of support groups  336- 373-1402 Call for more information  °Narcotics Anonymous (NA), [facility] [address], °[place]  2 meetings at this location  ° °  Residential Treatment Programs °Organization         Address  Phone  Notes  °[facility] [address],    °[place]  1-866-801-8205   °[facility] ° [address], [place], [place] 704-293-8524   °[facility] [address], [place] 336-845-3988 Admissions: 8am-3pm M-F  °[facility] [address]    °[place], [place] 336-841-1104   °[facility] [address], [place], [place] 336-379-7146   °[facility] [address]  °[place], [place] 336-285-9073   °[facility] Intensive Outpatient [address], [place], [place] 336-852-3033   °[facility] [address]  °[place], [place] 1-877-615-2722 or 336-784-9470   °[facility] [address] [place], [place] 336-227-7417 Accepts Medicaid  °[facility] [address]  °[place] 1-800-659-3381 Substance Abuse/Addiction Treatment  ° °[place] Behavioral Health Resources °Organization         Address  Phone  Notes  °[facility]  (888) 581-9988   °Julie Brannon, PhD [address] [place], [place]   (336) 349-5553 or (336) 951-0000   °[facility]   [address] °[place], [place] (336) 349-4454   °[facility] [address], [place], [place] (336) 342-8316 Insurance/Medicaid/sponsorship through [facility]  °[facility] [address]                                    [place], [place] (336) 342-8316 Therapy/tele-psych/case    °[facility] [address]  ° [place], [place] (336) 349-2233    °Dr. Arfeen  (336) 349-4544   °[facility] [place]  [employer] [place] [facility] 1) [address], [place] °2) [address], [place] °3)  [address], [place] (336) 349-3220 °(336) 342-7768 ° °(336) 342-8140   °[place] Child Abuse [facility] (336) 342-1394 or (336) 342-3537 (After Hours)    ° ° °

## 2015-06-30 NOTE — ED Notes (Signed)
Family at bedside. 

## 2016-05-09 ENCOUNTER — Telehealth: Payer: Self-pay | Admitting: Family Medicine

## 2016-05-09 NOTE — Telephone Encounter (Signed)
All we can do is send copy of sleep study results to [facility] and see if they can work with him to get on CPAP, and will need to demonstrate compliance with this.   The usual bare minimum time on CPAP is 30 days or more of use.  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

## 2016-05-09 NOTE — Telephone Encounter (Signed)
Pt came into the office.  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  He never started the sleep study.  He does have a machine from his mom.  He has talked with [facility] on [address] and they said if you will send over the settings rx that they will start him up.  Please respond to Vernona RiegerLaura as she will call him back on Friday.  ( I explained it had been over 2 years since you saw him and since the sleep study and was not sure that you would do this)    His request is send the settings and then [facility] can titrate as needed. Pt phone [phone]

## 2016-05-10 NOTE — Telephone Encounter (Addendum)
[facility] already has a copy of the Sleep study but they have to have a Rx with orders of the settings, they can not use just the sleep study.  Pt unable to come in at this time because he does not have insurance yet with starting this new job.  But he is unable to start the job until he gets DOT approved after he has starts the CPAP. He also started he was fully aware that he would have to be compliant with the CPAP now that he knows his job is at stake.

## 2016-05-13 NOTE — Telephone Encounter (Signed)
I have filled out form and [facility]ed [facility] to [facility] @ [phone] and to [facility] [phone]

## 2016-05-13 NOTE — Telephone Encounter (Signed)
Please get me a [facility] order to sign for auto titration/auto CPAP.  They already have the study, just need our order.

## 2016-05-13 NOTE — Telephone Encounter (Signed)
Pt called back & wanted to know once he starts the CPAP what are the requirements on getting passed for DOT.  Spoke with Vincenza HewsShane & he researched it & I called pt back & explained pt has to be on the CPAP for 30 day waiting period with minimum of 4 hours a day on CPAP before he can be approved & that will be for 90 days.  Also explained that is if he passes all the other qualifications.  Pt informed [facility] to [facility]

## 2016-06-04 ENCOUNTER — Telehealth: Payer: Self-pay

## 2016-06-04 NOTE — Telephone Encounter (Signed)
[name] with [facility] called to let us know that pt cancelled all services with CPAP titration because he watched a you tube video and figured it out himself.   [phone]

## 2016-06-04 NOTE — Telephone Encounter (Signed)
I believe the phone messages stem from his call about DOT.   Regardless of where he gets his DOT done, he will need CPAP compliance report which would have to be done through one of the suppliers of the CPAP machines.   If his CPAP doesn't have a label on where to call for servicing, then he will have to "establish" with a carrier. [facility] was contacted recently and we sent last sleep study info, however, he apparently told them to cancel services.   Just make sure he is aware that he will need a compliant report printed from his CPAP service before getting DOT done.   If he wants to use [facility], he needs to call them back to get this arranged.

## 2016-06-05 NOTE — Telephone Encounter (Signed)
Letter mailed to address on file with this information. Trixie Rude/RLB

## 2016-06-22 ENCOUNTER — Emergency Department (HOSPITAL_COMMUNITY): Payer: No Typology Code available for payment source

## 2016-06-22 ENCOUNTER — Emergency Department (HOSPITAL_COMMUNITY)
Admission: EM | Admit: 2016-06-22 | Discharge: 2016-06-22 | Disposition: A | Discharge status: Home / Self Care | Payer: No Typology Code available for payment source | Attending: Emergency Medicine | Admitting: Emergency Medicine

## 2016-06-22 ENCOUNTER — Encounter (HOSPITAL_COMMUNITY): Payer: Self-pay | Admitting: *Deleted

## 2016-06-22 DIAGNOSIS — S0990XA Unspecified injury of head, initial encounter: Secondary | ICD-10-CM | POA: Diagnosis present

## 2016-06-22 DIAGNOSIS — Y939 Activity, unspecified: Secondary | ICD-10-CM | POA: Insufficient documentation

## 2016-06-22 DIAGNOSIS — R109 Unspecified abdominal pain: Secondary | ICD-10-CM | POA: Diagnosis not present

## 2016-06-22 DIAGNOSIS — Z79899 Other long term (current) drug therapy: Secondary | ICD-10-CM | POA: Insufficient documentation

## 2016-06-22 DIAGNOSIS — S0083XA Contusion of other part of head, initial encounter: Secondary | ICD-10-CM | POA: Insufficient documentation

## 2016-06-22 DIAGNOSIS — F17290 Nicotine dependence, other tobacco product, uncomplicated: Secondary | ICD-10-CM | POA: Insufficient documentation

## 2016-06-22 DIAGNOSIS — Y999 Unspecified external cause status: Secondary | ICD-10-CM | POA: Insufficient documentation

## 2016-06-22 DIAGNOSIS — Y92410 Unspecified street and highway as the place of occurrence of the external cause: Secondary | ICD-10-CM | POA: Diagnosis not present

## 2016-06-22 LAB — I-STAT CHEM 8, ED
BUN: 12 mg/dL (ref 6–20)
CALCIUM ION: 1.1 mmol/L — AB (ref 1.15–1.40)
CREATININE: 1 mg/dL (ref 0.61–1.24)
Chloride: 104 mmol/L (ref 101–111)
Glucose, Bld: 79 mg/dL (ref 65–99)
HCT: 42 % (ref 39.0–52.0)
HEMOGLOBIN: 14.3 g/dL (ref 13.0–17.0)
Potassium: 3.5 mmol/L (ref 3.5–5.1)
SODIUM: 142 mmol/L (ref 135–145)
TCO2: 26 mmol/L (ref 0–100)

## 2016-06-22 MED ORDER — CYCLOBENZAPRINE HCL 10 MG PO TABS: 10.0000 mg | ORAL_TABLET | Freq: Every evening | ORAL | 0 refills | Status: AC | PRN

## 2016-06-22 MED ORDER — IOPAMIDOL (ISOVUE-300) INJECTION 61%
100.0000 mL | Freq: Once | INTRAVENOUS | Status: AC | PRN
  Administered 2016-06-22: 100 mL via INTRAVENOUS

## 2016-06-22 NOTE — ED Notes (Signed)
Below order not completed by EW. 

## 2016-06-22 NOTE — ED Notes (Signed)
Patient transported to CT 
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
## 2016-06-22 NOTE — Discharge Instructions (Signed)
Take anti-inflammatory medicines for the next week. Take this medicine with food. °Take muscle relaxer at bedtime to help you sleep. This medicine makes you drowsy so do not take before driving or work °Use a heating pad for sore muscles - use for 20 minutes several times a day ° °
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
